# Patient Record
Sex: Female | Born: 1965 | Race: White | Hispanic: No | Marital: Single | Smoking: Former smoker
Health system: Southern US, Community
[De-identification: ages and names within clinical notes are randomized; demographics above are authoritative.]

## PROBLEM LIST (undated history)

## (undated) DIAGNOSIS — I10 Essential (primary) hypertension: Secondary | ICD-10-CM

## (undated) DIAGNOSIS — E785 Hyperlipidemia, unspecified: Secondary | ICD-10-CM

## (undated) DIAGNOSIS — K219 Gastro-esophageal reflux disease without esophagitis: Secondary | ICD-10-CM

## (undated) DIAGNOSIS — F419 Anxiety disorder, unspecified: Secondary | ICD-10-CM

## (undated) DIAGNOSIS — G4730 Sleep apnea, unspecified: Secondary | ICD-10-CM

## (undated) DIAGNOSIS — J189 Pneumonia, unspecified organism: Secondary | ICD-10-CM

## (undated) DIAGNOSIS — J302 Other seasonal allergic rhinitis: Secondary | ICD-10-CM

## (undated) HISTORY — PX: EYE SURGERY: SHX253

## (undated) HISTORY — DX: Gastro-esophageal reflux disease without esophagitis: K21.9

## (undated) HISTORY — PX: NOVASURE ABLATION: SHX5394

## (undated) HISTORY — PX: ABDOMINAL HYSTERECTOMY: SHX81

## (undated) HISTORY — PX: TONSILLECTOMY: SUR1361

## (undated) HISTORY — PX: WISDOM TOOTH EXTRACTION: SHX21

---

## 2002-10-21 ENCOUNTER — Emergency Department (HOSPITAL_COMMUNITY): Admission: AC | Admit: 2002-10-21 | Discharge: 2002-10-21 | Payer: Self-pay

## 2002-10-21 ENCOUNTER — Encounter: Payer: Self-pay | Admitting: Emergency Medicine

## 2002-12-09 ENCOUNTER — Other Ambulatory Visit: Admission: RE | Admit: 2002-12-09 | Discharge: 2002-12-09 | Payer: Self-pay | Admitting: *Deleted

## 2003-12-10 ENCOUNTER — Other Ambulatory Visit: Admission: RE | Admit: 2003-12-10 | Discharge: 2003-12-10 | Payer: Self-pay | Admitting: *Deleted

## 2004-05-25 ENCOUNTER — Ambulatory Visit (HOSPITAL_COMMUNITY): Admission: RE | Admit: 2004-05-25 | Discharge: 2004-05-25 | Payer: Self-pay | Admitting: Family Medicine

## 2008-04-25 ENCOUNTER — Emergency Department (HOSPITAL_COMMUNITY): Admission: EM | Admit: 2008-04-25 | Discharge: 2008-04-25 | Payer: Self-pay | Admitting: Emergency Medicine

## 2008-07-29 ENCOUNTER — Encounter: Admission: RE | Admit: 2008-07-29 | Discharge: 2008-07-29 | Payer: Self-pay | Admitting: Family Medicine

## 2008-08-11 ENCOUNTER — Encounter: Admission: RE | Admit: 2008-08-11 | Discharge: 2008-08-11 | Payer: Self-pay | Admitting: Family Medicine

## 2009-01-25 ENCOUNTER — Encounter: Admission: RE | Admit: 2009-01-25 | Discharge: 2009-01-25 | Payer: Self-pay | Admitting: Family Medicine

## 2009-08-05 ENCOUNTER — Encounter: Admission: RE | Admit: 2009-08-05 | Discharge: 2009-08-05 | Payer: Self-pay | Admitting: Family Medicine

## 2010-08-10 ENCOUNTER — Encounter: Admission: RE | Admit: 2010-08-10 | Discharge: 2010-08-10 | Payer: Self-pay | Admitting: Family Medicine

## 2011-07-28 ENCOUNTER — Other Ambulatory Visit: Payer: Self-pay | Admitting: Family Medicine

## 2011-07-28 DIAGNOSIS — Z1231 Encounter for screening mammogram for malignant neoplasm of breast: Secondary | ICD-10-CM

## 2011-08-21 ENCOUNTER — Ambulatory Visit
Admission: RE | Admit: 2011-08-21 | Discharge: 2011-08-21 | Disposition: A | Discharge status: Home / Self Care | Payer: BC Managed Care – PPO | Source: Ambulatory Visit | Attending: Family Medicine | Admitting: Family Medicine

## 2011-08-21 DIAGNOSIS — Z1231 Encounter for screening mammogram for malignant neoplasm of breast: Secondary | ICD-10-CM

## 2012-07-31 ENCOUNTER — Other Ambulatory Visit: Payer: Self-pay | Admitting: Family Medicine

## 2012-07-31 DIAGNOSIS — Z1231 Encounter for screening mammogram for malignant neoplasm of breast: Secondary | ICD-10-CM

## 2012-09-12 ENCOUNTER — Ambulatory Visit
Admission: RE | Admit: 2012-09-12 | Discharge: 2012-09-12 | Disposition: A | Discharge status: Home / Self Care | Payer: BC Managed Care – PPO | Source: Ambulatory Visit | Attending: Family Medicine | Admitting: Family Medicine

## 2012-09-12 DIAGNOSIS — Z1231 Encounter for screening mammogram for malignant neoplasm of breast: Secondary | ICD-10-CM

## 2013-09-03 ENCOUNTER — Other Ambulatory Visit: Payer: Self-pay

## 2013-09-03 DIAGNOSIS — Z1231 Encounter for screening mammogram for malignant neoplasm of breast: Secondary | ICD-10-CM

## 2013-10-09 ENCOUNTER — Ambulatory Visit
Admission: RE | Admit: 2013-10-09 | Discharge: 2013-10-09 | Disposition: A | Discharge status: Home / Self Care | Payer: BC Managed Care – PPO | Source: Ambulatory Visit

## 2013-10-09 DIAGNOSIS — Z1231 Encounter for screening mammogram for malignant neoplasm of breast: Secondary | ICD-10-CM

## 2013-10-10 ENCOUNTER — Other Ambulatory Visit: Payer: Self-pay | Admitting: Family Medicine

## 2013-10-10 DIAGNOSIS — R928 Other abnormal and inconclusive findings on diagnostic imaging of breast: Secondary | ICD-10-CM

## 2013-10-21 ENCOUNTER — Ambulatory Visit
Admission: RE | Admit: 2013-10-21 | Discharge: 2013-10-21 | Disposition: A | Discharge status: Home / Self Care | Payer: BC Managed Care – PPO | Source: Ambulatory Visit | Attending: Family Medicine | Admitting: Family Medicine

## 2013-10-21 DIAGNOSIS — R928 Other abnormal and inconclusive findings on diagnostic imaging of breast: Secondary | ICD-10-CM

## 2014-11-04 ENCOUNTER — Other Ambulatory Visit: Payer: Self-pay

## 2014-11-04 DIAGNOSIS — Z1231 Encounter for screening mammogram for malignant neoplasm of breast: Secondary | ICD-10-CM

## 2014-11-16 ENCOUNTER — Ambulatory Visit: Payer: Self-pay

## 2014-11-17 ENCOUNTER — Ambulatory Visit
Admission: RE | Admit: 2014-11-17 | Discharge: 2014-11-17 | Disposition: A | Discharge status: Home / Self Care | Payer: BLUE CROSS/BLUE SHIELD | Source: Ambulatory Visit

## 2014-11-17 DIAGNOSIS — Z1231 Encounter for screening mammogram for malignant neoplasm of breast: Secondary | ICD-10-CM

## 2015-10-18 ENCOUNTER — Other Ambulatory Visit: Payer: Self-pay

## 2015-10-18 DIAGNOSIS — Z1231 Encounter for screening mammogram for malignant neoplasm of breast: Secondary | ICD-10-CM

## 2015-11-19 ENCOUNTER — Ambulatory Visit
Admission: RE | Admit: 2015-11-19 | Discharge: 2015-11-19 | Disposition: A | Discharge status: Home / Self Care | Payer: BLUE CROSS/BLUE SHIELD | Source: Ambulatory Visit

## 2015-11-19 DIAGNOSIS — Z1231 Encounter for screening mammogram for malignant neoplasm of breast: Secondary | ICD-10-CM

## 2016-01-27 DIAGNOSIS — N939 Abnormal uterine and vaginal bleeding, unspecified: Secondary | ICD-10-CM | POA: Diagnosis not present

## 2016-04-05 DIAGNOSIS — N920 Excessive and frequent menstruation with regular cycle: Secondary | ICD-10-CM | POA: Diagnosis not present

## 2016-04-06 ENCOUNTER — Other Ambulatory Visit: Payer: Self-pay | Admitting: Obstetrics and Gynecology

## 2016-04-07 ENCOUNTER — Encounter (HOSPITAL_COMMUNITY): Payer: Self-pay

## 2016-04-07 ENCOUNTER — Encounter (HOSPITAL_COMMUNITY)
Admission: RE | Admit: 2016-04-07 | Discharge: 2016-04-07 | Disposition: A | Discharge status: Home / Self Care | Payer: BLUE CROSS/BLUE SHIELD | Source: Ambulatory Visit | Attending: Obstetrics and Gynecology | Admitting: Obstetrics and Gynecology

## 2016-04-07 ENCOUNTER — Other Ambulatory Visit: Payer: Self-pay

## 2016-04-07 DIAGNOSIS — Z01812 Encounter for preprocedural laboratory examination: Secondary | ICD-10-CM | POA: Insufficient documentation

## 2016-04-07 HISTORY — DX: Essential (primary) hypertension: I10

## 2016-04-07 HISTORY — DX: Other seasonal allergic rhinitis: J30.2

## 2016-04-07 HISTORY — DX: Hyperlipidemia, unspecified: E78.5

## 2016-04-07 HISTORY — DX: Anxiety disorder, unspecified: F41.9

## 2016-04-07 LAB — BASIC METABOLIC PANEL
ANION GAP: 10 (ref 5–15)
BUN: 16 mg/dL (ref 6–20)
CALCIUM: 9.1 mg/dL (ref 8.9–10.3)
CO2: 23 mmol/L (ref 22–32)
Chloride: 101 mmol/L (ref 101–111)
Creatinine, Ser: 0.82 mg/dL (ref 0.44–1.00)
Glucose, Bld: 179 mg/dL — ABNORMAL HIGH (ref 65–99)
Potassium: 3.7 mmol/L (ref 3.5–5.1)
SODIUM: 134 mmol/L — AB (ref 135–145)

## 2016-04-07 LAB — CBC
HCT: 36 % (ref 36.0–46.0)
HEMOGLOBIN: 12 g/dL (ref 12.0–15.0)
MCH: 30.2 pg (ref 26.0–34.0)
MCHC: 33.3 g/dL (ref 30.0–36.0)
MCV: 90.5 fL (ref 78.0–100.0)
Platelets: 435 10*3/uL — ABNORMAL HIGH (ref 150–400)
RBC: 3.98 MIL/uL (ref 3.87–5.11)
RDW: 14 % (ref 11.5–15.5)
WBC: 18.6 10*3/uL — AB (ref 4.0–10.5)

## 2016-04-07 NOTE — Patient Instructions (Addendum)
Your procedure is scheduled on: Tuesday, 7/18  Enter through the Main Entrance of [facility] at: [place] up the phone at the desk and dial [phone].  Call this number if you have problems the morning of surgery: [phone].  Remember: Do NOT eat food after midnight Monday, 7/17  Do NOT drink clear liquids after 9am Tuesday, day of surgery.  Take these medicines the morning of surgery with a SIP OF WATER:  Losartan, simvastatin, celexa, zyrtec and flonase if needed.  Do NOT wear jewelry (body piercing), metal hair clips/bobby pins, make-up, or nail polish. Do NOT wear lotions, powders, or perfumes.  You may wear deoderant. Do NOT shave for 48 hours prior to surgery. Do NOT bring valuables to the hospital.  Have a responsible adult drive you home and stay with you for 24 hours after your procedure.  Home with friend Laurelyn Sickle cell [phone].

## 2016-04-07 NOTE — Patient Instructions (Signed)
Your procedure is scheduled on: Tuesday, 7/18  Enter through the Main Entrance of [facility] at: [place] up the phone at the desk and dial [phone].  Call this number if you have problems the morning of surgery: [phone].  Remember: Do NOT eat food after midnight Monday, 7/17  Do NOT drink clear liquids after: 9 am Tuesday, day of surgery  Take these medicines the morning of surgery with a SIP OF WATER: losartan, simvastatin, flonase if needed  Do NOT wear jewelry (body piercing), metal hair clips/bobby pins, make-up, or nail polish. Do NOT wear lotions, powders, or perfumes.  You may wear deoderant. Do NOT shave for 48 hours prior to surgery. Do NOT bring valuables to the hospital. Contacts, dentures, or bridgework may not be worn into surgery. Leave suitcase in car.  After surgery it may be brought to your room.  For patients admitted to the hospital, checkout time is 11:00 AM the day of discharge. Have a responsible adult drive you home and stay with you for 24 hours after your procedure

## 2016-04-10 MED ORDER — CEFAZOLIN SODIUM-DEXTROSE 2-4 GM/100ML-% IV SOLN
2.0000 g | INTRAVENOUS | Status: AC
  Administered 2016-04-11: 2 g via INTRAVENOUS

## 2016-04-10 NOTE — H&P (Signed)
NAME:  Jenny Harrington, Jenny Harrington NO.:  [id number]  MEDICAL RECORD NO.:  [id number]  LOCATION:  [facility]                         FACILITY:  [facility]  PHYSICIAN:  Lovenia Kim, M.D.DATE OF BIRTH:  [birth year]  DATE OF ADMISSION:  04/06/2016 DATE OF DISCHARGE:                             HISTORY & PHYSICAL   CHIEF COMPLAINT:  Refractory menorrhagia.  HISTORY OF PRESENT ILLNESS:  A 50 year old white female, G0, P0 with a history of failed NovaSure ablation for HTA ablation.  ALLERGIES:  She has no known allergies.  MEDICATIONS:  Birth control pills.  Zyrtec as needed.  Aleve as needed, baby aspirin, simvastatin, losartan, multivitamin, Celexa.  SOCIAL HISTORY:  She is a nonsmoker, nondrinker.  She denies domestic or physical violence.  SURGICAL HISTORY:  Remarkable for a NovaSure ablation __________ 2016 with persistent menorrhagia.  She has a history of tonsillectomy.  No previous pregnancy.  PHYSICAL EXAMINATION:  GENERAL:  She is a well-developed, well- nourished, white female, in no acute distress. HEENT:  Normal. NECK:  Supple.  Full range of motion. LUNGS:  Clear. ABDOMEN:  Soft, nontender. PELVIC:  Reveals a bulky anteflexed uterus with known fibroids.  No adnexal masses. EXTREMITIES:  There are no cords. NEUROLOGIC:  Nonfocal. SKIN:  Intact.  IMPRESSION:  Refractory menorrhagia with failed NovaSure ablation.  PLAN:  Proceed with HTA ablation.  Risks and benefits discussed. Inability to predict the future outcome has been acknowledged.  The patient declines hysterectomy.  At this time, not a candidate for repeat NovaSure ablation. Risks of anesthesia, infection, bleeding, injury to surrounding organs, possible need for repair were discussed.  Delayed versus immediate complications to include bowel and bladder injury noted.  The patient acknowledges and wishes to proceed.     Lovenia Kim, M.D.     RJT/MEDQ  D:  04/10/2016  T:  04/10/2016  Job:   [id number]

## 2016-04-11 ENCOUNTER — Ambulatory Visit (HOSPITAL_COMMUNITY): Payer: BLUE CROSS/BLUE SHIELD | Admitting: Anesthesiology

## 2016-04-11 ENCOUNTER — Encounter (HOSPITAL_COMMUNITY)
Admission: RE | Disposition: A | Discharge status: Home / Self Care | Payer: Self-pay | Source: Ambulatory Visit | Attending: Obstetrics and Gynecology

## 2016-04-11 ENCOUNTER — Encounter (HOSPITAL_COMMUNITY): Payer: Self-pay | Admitting: *Deleted

## 2016-04-11 ENCOUNTER — Ambulatory Visit (HOSPITAL_COMMUNITY)
Admission: RE | Admit: 2016-04-11 | Discharge: 2016-04-11 | Disposition: A | Discharge status: Home / Self Care | Payer: BLUE CROSS/BLUE SHIELD | Source: Ambulatory Visit | Attending: Obstetrics and Gynecology | Admitting: Obstetrics and Gynecology

## 2016-04-11 DIAGNOSIS — Z87891 Personal history of nicotine dependence: Secondary | ICD-10-CM | POA: Diagnosis not present

## 2016-04-11 DIAGNOSIS — N920 Excessive and frequent menstruation with regular cycle: Secondary | ICD-10-CM | POA: Diagnosis not present

## 2016-04-11 DIAGNOSIS — I10 Essential (primary) hypertension: Secondary | ICD-10-CM | POA: Insufficient documentation

## 2016-04-11 HISTORY — PX: DILITATION & CURRETTAGE/HYSTROSCOPY WITH HYDROTHERMAL ABLATION: SHX5570

## 2016-04-11 SURGERY — DILATATION & CURETTAGE/HYSTEROSCOPY WITH HYDROTHERMAL ABLATION
Anesthesia: General | Site: Vagina

## 2016-04-11 MED ORDER — SODIUM CHLORIDE 0.9 % IR SOLN
Status: DC | PRN
  Administered 2016-04-11: 3000 mL

## 2016-04-11 MED ORDER — ONDANSETRON HCL 4 MG/2ML IJ SOLN
INTRAMUSCULAR | Status: DC | PRN
  Administered 2016-04-11: 4 mg via INTRAVENOUS

## 2016-04-11 MED ORDER — DEXAMETHASONE SODIUM PHOSPHATE 4 MG/ML IJ SOLN
INTRAMUSCULAR | Status: DC | PRN
  Administered 2016-04-11: 4 mg via INTRAVENOUS

## 2016-04-11 MED ORDER — BUPIVACAINE HCL (PF) 0.25 % IJ SOLN
INTRAMUSCULAR | Status: AC
  Filled 2016-04-11: qty 10

## 2016-04-11 MED ORDER — LIDOCAINE 2% (20 MG/ML) 5 ML SYRINGE
INTRAMUSCULAR | Status: DC | PRN
  Administered 2016-04-11: 60 mg via INTRAVENOUS

## 2016-04-11 MED ORDER — FENTANYL CITRATE (PF) 100 MCG/2ML IJ SOLN
INTRAMUSCULAR | Status: DC | PRN
  Administered 2016-04-11: 100 ug via INTRAVENOUS

## 2016-04-11 MED ORDER — OXYCODONE-ACETAMINOPHEN 5-325 MG PO TABS: 1.0000 | ORAL_TABLET | ORAL | Status: DC | PRN

## 2016-04-11 MED ORDER — SCOPOLAMINE 1 MG/3DAYS TD PT72
1.0000 | MEDICATED_PATCH | Freq: Once | TRANSDERMAL | Status: DC
  Administered 2016-04-11: 1.5 mg via TRANSDERMAL

## 2016-04-11 MED ORDER — KETOROLAC TROMETHAMINE 30 MG/ML IJ SOLN
INTRAMUSCULAR | Status: DC | PRN
  Administered 2016-04-11: 30 mg via INTRAVENOUS

## 2016-04-11 MED ORDER — BUPIVACAINE HCL (PF) 0.25 % IJ SOLN
INTRAMUSCULAR | Status: DC | PRN
  Administered 2016-04-11: 10 mL

## 2016-04-11 MED ORDER — DEXAMETHASONE SODIUM PHOSPHATE 4 MG/ML IJ SOLN
INTRAMUSCULAR | Status: AC
  Filled 2016-04-11: qty 1

## 2016-04-11 MED ORDER — FENTANYL CITRATE (PF) 100 MCG/2ML IJ SOLN: 25.0000 ug | INTRAMUSCULAR | Status: DC | PRN

## 2016-04-11 MED ORDER — LIDOCAINE HCL (CARDIAC) 20 MG/ML IV SOLN
INTRAVENOUS | Status: AC
  Filled 2016-04-11: qty 5

## 2016-04-11 MED ORDER — LACTATED RINGERS IV SOLN
INTRAVENOUS | Status: DC
  Administered 2016-04-11 (×3): via INTRAVENOUS

## 2016-04-11 MED ORDER — GLYCOPYRROLATE 0.2 MG/ML IJ SOLN
INTRAMUSCULAR | Status: DC | PRN
  Administered 2016-04-11: 0.2 mg via INTRAVENOUS

## 2016-04-11 MED ORDER — ONDANSETRON HCL 4 MG/2ML IJ SOLN
INTRAMUSCULAR | Status: AC
  Filled 2016-04-11: qty 2

## 2016-04-11 MED ORDER — PROPOFOL 10 MG/ML IV BOLUS
INTRAVENOUS | Status: AC
  Filled 2016-04-11: qty 20

## 2016-04-11 MED ORDER — SCOPOLAMINE 1 MG/3DAYS TD PT72
MEDICATED_PATCH | TRANSDERMAL | Status: DC
Start: 2016-04-11 — End: 2016-04-11
  Filled 2016-04-11: qty 1

## 2016-04-11 MED ORDER — FENTANYL CITRATE (PF) 100 MCG/2ML IJ SOLN
INTRAMUSCULAR | Status: AC
  Filled 2016-04-11: qty 2

## 2016-04-11 MED ORDER — MIDAZOLAM HCL 2 MG/2ML IJ SOLN
INTRAMUSCULAR | Status: AC
  Filled 2016-04-11: qty 2

## 2016-04-11 MED ORDER — PROPOFOL 10 MG/ML IV BOLUS
INTRAVENOUS | Status: DC | PRN
  Administered 2016-04-11: 200 mg via INTRAVENOUS

## 2016-04-11 MED ORDER — MIDAZOLAM HCL 5 MG/5ML IJ SOLN
INTRAMUSCULAR | Status: DC | PRN
  Administered 2016-04-11: 2 mg via INTRAVENOUS

## 2016-04-11 MED ORDER — PROMETHAZINE HCL 25 MG/ML IJ SOLN: 6.2500 mg | INTRAMUSCULAR | Status: DC | PRN

## 2016-04-11 MED ORDER — HYDROCODONE-ACETAMINOPHEN 7.5-325 MG PO TABS: 1.0000 | ORAL_TABLET | Freq: Once | ORAL | Status: DC | PRN

## 2016-04-11 SURGICAL SUPPLY — 14 items
CLOTH BEACON ORANGE TIMEOUT ST (SAFETY) ×2 IMPLANT
CONTAINER PREFILL 10% NBF 60ML (FORM) IMPLANT
ELECT REM PT RETURN 9FT ADLT (ELECTROSURGICAL)
ELECTRODE REM PT RTRN 9FT ADLT (ELECTROSURGICAL) IMPLANT
GLOVE BIO SURGEON STRL SZ7.5 (GLOVE) ×2 IMPLANT
GLOVE BIOGEL PI IND STRL 7.0 (GLOVE) ×1 IMPLANT
GLOVE BIOGEL PI INDICATOR 7.0 (GLOVE) ×1
GOWN STRL REUS W/TWL LRG LVL3 (GOWN DISPOSABLE) ×6 IMPLANT
NS IRRIG 1000ML POUR BTL (IV SOLUTION) IMPLANT
PACK VAGINAL MINOR WOMEN LF (CUSTOM PROCEDURE TRAY) ×2 IMPLANT
PAD OB MATERNITY 4.3X12.25 (PERSONAL CARE ITEMS) ×2 IMPLANT
SET GENESYS HTA PROCERVA (MISCELLANEOUS) ×2 IMPLANT
TOWEL OR 17X24 6PK STRL BLUE (TOWEL DISPOSABLE) ×4 IMPLANT
WATER STERILE IRR 1000ML POUR (IV SOLUTION) ×2 IMPLANT

## 2016-04-11 NOTE — Anesthesia Preprocedure Evaluation (Signed)
Anesthesia Evaluation  Patient identified by MRN, date of birth, ID band Patient awake    Reviewed: Allergy & Precautions, H&P , NPO status , Patient's Chart, lab work & pertinent test results  History of Anesthesia Complications Negative for: history of anesthetic complications  Airway Mallampati: II  TM Distance: >3 FB Neck ROM: full    Dental no notable dental hx.    Pulmonary former smoker,    Pulmonary exam normal breath sounds clear to auscultation       Cardiovascular hypertension, Normal cardiovascular exam Rhythm:regular Rate:Normal     Neuro/Psych Anxiety negative neurological ROS     GI/Hepatic negative GI ROS, Neg liver ROS,   Endo/Other  negative endocrine ROS  Renal/GU negative Renal ROS     Musculoskeletal   Abdominal   Peds  Hematology negative hematology ROS (+)   Anesthesia Other Findings   Reproductive/Obstetrics negative OB ROS                             Anesthesia Physical Anesthesia Plan  ASA: II  Anesthesia Plan: General   Post-op Pain Management:    Induction: Intravenous  Airway Management Planned: LMA  Additional Equipment:   Intra-op Plan:   Post-operative Plan:   Informed Consent: I have reviewed the patients History and Physical, chart, labs and discussed the procedure including the risks, benefits and alternatives for the proposed anesthesia with the patient or authorized representative who has indicated his/her understanding and acceptance.   Dental Advisory Given  Plan Discussed with: Anesthesiologist, CRNA and Surgeon  Anesthesia Plan Comments:         Anesthesia Quick Evaluation

## 2016-04-11 NOTE — Anesthesia Procedure Notes (Signed)
Procedure Name: LMA Insertion Date/Time: 04/11/2016 1:51 PM Performed by: Vernice Jefferson Pre-anesthesia Checklist: Patient identified, Emergency Drugs available, Suction available, Timeout performed and Patient being monitored Patient Re-evaluated:Patient Re-evaluated prior to inductionOxygen Delivery Method: Circle system utilized Preoxygenation: Pre-oxygenation with 100% oxygen Intubation Type: IV induction LMA: LMA inserted Grade View: Grade II Number of attempts: 1 Dental Injury: Teeth and Oropharynx as per pre-operative assessment

## 2016-04-11 NOTE — Progress Notes (Signed)
Patient ID: Jenny Harrington, female   DOB: [birth year], 50 y.o.   MRN: [id number] Patient seen and examined. Consent witnessed and signed. No changes noted. Update completed.

## 2016-04-11 NOTE — Transfer of Care (Signed)
Immediate Anesthesia Transfer of Care Note  Patient: DANNELLE BARGERSTOCK  Procedure(s) Performed: Procedure(s): DILATATION & CURETTAGE/HYSTEROSCOPY WITH HYDROTHERMAL ABLATION (N/A)  Patient Location: PACU  Anesthesia Type:General  Level of Consciousness: awake, alert  and oriented  Airway & Oxygen Therapy: Patient Spontanous Breathing and Patient connected to nasal cannula oxygen  Post-op Assessment: Report given to RN and Post -op Vital signs reviewed and stable  Post vital signs: Reviewed and stable  Last Vitals:  Filed Vitals:   04/11/16 1212  BP: 154/94  Pulse: 73  Temp: 36.8 C  Resp: 20    Last Pain: There were no vitals filed for this visit.    Patients Stated Pain Goal: 3 ([id number])  Complications: No apparent anesthesia complications

## 2016-04-11 NOTE — Anesthesia Postprocedure Evaluation (Signed)
Anesthesia Post Note  Patient: Jenny Harrington  Procedure(s) Performed: Procedure(s) (LRB): DILATATION & CURETTAGE/HYSTEROSCOPY WITH HYDROTHERMAL ABLATION (N/A)  Patient location during evaluation: PACU Anesthesia Type: General Level of consciousness: awake and alert Pain management: pain level controlled Vital Signs Assessment: post-procedure vital signs reviewed and stable Respiratory status: spontaneous breathing, nonlabored ventilation, respiratory function stable and patient connected to nasal cannula oxygen Cardiovascular status: blood pressure returned to baseline and stable Postop Assessment: no signs of nausea or vomiting Anesthetic complications: no     Last Vitals:  Filed Vitals:   04/11/16 1430 04/11/16 1445  BP: 146/81 152/78  Pulse: 91 78  Temp:    Resp: 15 15    Last Pain:  Filed Vitals:   04/11/16 1456  PainSc: 0-No pain   Pain Goal: Patients Stated Pain Goal: 3 (04/11/16 1445)               Zenaida Deed

## 2016-04-11 NOTE — Discharge Instructions (Addendum)
DISCHARGE INSTRUCTIONS: HYSTEROSCOPY / ENDOMETRIAL ABLATION The following instructions have been prepared to help you care for yourself upon your return home.  May Remove Scop patch on or before  May take Ibuprofen after  May take stool softner while taking narcotic pain medication to prevent constipation.  Drink plenty of water.  Personal hygiene:  Use sanitary pads for vaginal drainage, not tampons.  Shower the day after your procedure.  NO tub baths, pools or Jacuzzis for 2-3 weeks.  Wipe front to back after using the bathroom.  Activity and limitations:  Do NOT drive or operate any equipment for 24 hours. The effects of anesthesia are still present and drowsiness may result.  Do NOT rest in bed all day.  Walking is encouraged.  Walk up and down stairs slowly.  You may resume your normal activity in one to two days or as indicated by your physician. Sexual activity: NO intercourse for at least 2 weeks after the procedure, or as indicated by your Doctor.  Diet: Eat a light meal as desired this evening. You may resume your usual diet tomorrow.  Return to Work: You may resume your work activities in one to two days or as indicated by Marine scientist.  What to expect after your surgery: Expect to have vaginal bleeding/discharge for 2-3 days and spotting for up to 10 days. It is not unusual to have soreness for up to 1-2 weeks. You may have a slight burning sensation when you urinate for the first day. Mild cramps may continue for a couple of days. You may have a regular period in 2-6 weeks.  Call your doctor for any of the following:  Excessive vaginal bleeding or clotting, saturating and changing one pad every hour.  Inability to urinate 6 hours after discharge from hospital.  Pain not relieved by pain medication.  Fever of 100.4 F or greater.  Unusual vaginal discharge or odor.  Return to office _________________Call for an appointment  ___________________ Patients signature: ______________________ Nurses signature ________________________  Post Anesthesia Care Unit [phone] Post Anesthesia Home Care Instructions  NO IBUPROFEN PRODUCTS UNTIL: 8:10 PM TONIGHT Post Anesthesia Home Care Instructions  Activity: Get plenty of rest for the remainder of the day. A responsible adult should stay with you for 24 hours following the procedure.  For the next 24 hours, DO NOT: -Drive a car -Paediatric nurse -Drink alcoholic beverages -Take any medication unless instructed by your physician -Make any legal decisions or sign important papers.  Meals: Start with liquid foods such as gelatin or soup. Progress to regular foods as tolerated. Avoid greasy, spicy, heavy foods. If nausea and/or vomiting occur, drink only clear liquids until the nausea and/or vomiting subsides. Call your physician if vomiting continues.  Special Instructions/Symptoms: Your throat may feel dry or sore from the anesthesia or the breathing tube placed in your throat during surgery. If this causes discomfort, gargle with warm salt water. The discomfort should disappear within 24 hours.  If you had a scopolamine patch placed behind your ear for the management of post- operative nausea and/or vomiting:  1. The medication in the patch is effective for 72 hours, after which it should be removed.  Wrap patch in a tissue and discard in the trash. Wash hands thoroughly with soap and water. 2. You may remove the patch earlier than 72 hours if you experience unpleasant side effects which may include dry mouth, dizziness or visual disturbances. 3. Avoid touching the patch. Wash your hands with soap and water  after contact with the patch.     Activity: Get plenty of rest for the remainder of the day. A responsible adult should stay with you for 24 hours following the procedure.  For the next 24 hours, DO NOT: -Drive a car -Paediatric nurse -Drink alcoholic  beverages -Take any medication unless instructed by your physician -Make any legal decisions or sign important papers.  Meals: Start with liquid foods such as gelatin or soup. Progress to regular foods as tolerated. Avoid greasy, spicy, heavy foods. If nausea and/or vomiting occur, drink only clear liquids until the nausea and/or vomiting subsides. Call your physician if vomiting continues.  Special Instructions/Symptoms: Your throat may feel dry or sore from the anesthesia or the breathing tube placed in your throat during surgery. If this causes discomfort, gargle with warm salt water. The discomfort should disappear within 24 hours.  If you had a scopolamine patch placed behind your ear for the management of post- operative nausea and/or vomiting:  1. The medication in the patch is effective for 72 hours, after which it should be removed.  Wrap patch in a tissue and discard in the trash. Wash hands thoroughly with soap and water. 2. You may remove the patch earlier than 72 hours if you experience unpleasant side effects which may include dry mouth, dizziness or visual disturbances. 3. Avoid touching the patch. Wash your hands with soap and water after contact with the patch.

## 2016-04-11 NOTE — Op Note (Signed)
04/11/2016  2:06 PM  PATIENT:  Jenny Harrington  50 y.o. female  PRE-OPERATIVE DIAGNOSIS:  Menorrhagia, Failed NovaSure Ablation  POST-OPERATIVE DIAGNOSIS:  Menorrhagia, Failed NovaSure Ablation  PROCEDURE:  Procedure(s): DILATATION & CURETTAGE/HYSTEROSCOPY WITH HYDROTHERMAL ABLATION  SURGEON:  Surgeon(s): Brien Few, MD  ASSISTANTS: none   ANESTHESIA:   local and general  ESTIMATED BLOOD LOSS: minimal  DRAINS: none   LOCAL MEDICATIONS USED:  MARCAINE    and Amount: 10 ml  SPECIMEN:  No Specimen  DISPOSITION OF SPECIMEN:  N/A  COUNTS:  YES  DICTATION #: done  PLAN OF CARE: dc home  PATIENT DISPOSITION:  PACU - hemodynamically stable.

## 2016-04-12 NOTE — Op Note (Signed)
NAME:  Jenny Harrington, Jenny Harrington NO.:  [id number]  MEDICAL RECORD NO.:  [id number]  LOCATION:  [place]                           FACILITY:  [facility]  PHYSICIAN:  Lovenia Kim, M.D.DATE OF BIRTH:  1965-12-28  DATE OF PROCEDURE: DATE OF DISCHARGE:  04/07/2016                              OPERATIVE REPORT   PREOPERATIVE DIAGNOSIS:  Refractory menorrhagia with failed NovaSure endometrial ablation.  POSTOPERATIVE DIAGNOSIS:  Refractory menorrhagia with failed NovaSure endometrial ablation.  PROCEDURE:  Diagnostic hysteroscopy with HTA ablation.  SURGEON:  Lovenia Kim, MD.  ASSISTANT:  None.  ANESTHESIA:  General and local.  ESTIMATED BLOOD LOSS:  Less than 50 mL.  FLUID DEFICIT:  Less than 50 mL.  COMPLICATIONS:  None.  DRAINS:  None.  COUNTS:  Correct.  The patient was sent to recovery in good condition.  BRIEF OPERATIVE NOTE:  After being apprised of risks of anesthesia, infection, bleeding, injury to surrounding organs, possible need for repair, delayed versus immediate complications to include bowel and bladder injury, possible need for repair, patient was brought to the operating room where she was administered a general anesthetic without complications.  Prepped and draped in usual sterile fashion. Catheterized for 200 mL of clear urine.  Exam under anesthesia revealed a bulky anteflexed uterus with known uterine fibroids, approximately 8- 10 weeks' size.  No adnexal masses appreciated.  At this time, cervix easily dilated up 27 Pratt dilator.  Hysteroscope reveals a narrowed stenotic endometrial cavity, no evidence of perforation.  HTA ablation was performed with the appropriate seal noted to warming and cooling phases as previously noted.  Well- ablated small cavity is noted postprocedure.  All instruments were removed.  Good hemostasis was noted.  The patient tolerated the procedure well, was awakened, and transferred to recovery in  good condition.     Lovenia Kim, M.D.     RJT/MEDQ  D:  04/11/2016  T:  04/12/2016  Job:  [id number]

## 2016-04-14 ENCOUNTER — Encounter (HOSPITAL_COMMUNITY): Payer: Self-pay | Admitting: Obstetrics and Gynecology

## 2016-05-17 DIAGNOSIS — Z01419 Encounter for gynecological examination (general) (routine) without abnormal findings: Secondary | ICD-10-CM | POA: Diagnosis not present

## 2016-05-17 DIAGNOSIS — Z6834 Body mass index (BMI) 34.0-34.9, adult: Secondary | ICD-10-CM | POA: Diagnosis not present

## 2016-06-21 DIAGNOSIS — H524 Presbyopia: Secondary | ICD-10-CM | POA: Diagnosis not present

## 2016-06-21 DIAGNOSIS — H5213 Myopia, bilateral: Secondary | ICD-10-CM | POA: Diagnosis not present

## 2016-06-21 DIAGNOSIS — Z9889 Other specified postprocedural states: Secondary | ICD-10-CM | POA: Diagnosis not present

## 2016-06-27 ENCOUNTER — Other Ambulatory Visit: Payer: Self-pay | Admitting: Obstetrics and Gynecology

## 2016-07-03 NOTE — Patient Instructions (Signed)
Your procedure is scheduled on:  Thursday, Oct. 19, 2017  Enter through the [employer] of [facility] at:  11:30 AM  Pick up the phone at the desk and dial [phone].  Call this number if you have problems the morning of surgery: [phone].  Remember: Do NOT eat food:  After Midnight Wednesday, Oct. 18, 2017  Do NOT drink clear liquids after:  9:00 AM day of surgery  Take these medicines the morning of surgery with a SIP OF WATER:  Citalopram, Losartan, Simvastatin  Stop ALL herbal medications at this time   Do NOT wear jewelry (body piercing), metal hair clips/bobby pins, make-up, or nail polish. Do NOT wear lotions, powders, or perfumes.  You may wear deodorant. Do NOT shave for 48 hours prior to surgery. Do NOT bring valuables to the hospital. Contacts, dentures, or bridgework may not be worn into surgery.  Leave suitcase in car.  After surgery it may be brought to your room.  For patients admitted to the hospital, checkout time is 11:00 AM the day of discharge.

## 2016-07-04 ENCOUNTER — Encounter (HOSPITAL_COMMUNITY): Payer: Self-pay

## 2016-07-04 ENCOUNTER — Encounter (HOSPITAL_COMMUNITY)
Admission: RE | Admit: 2016-07-04 | Discharge: 2016-07-04 | Disposition: A | Discharge status: Home / Self Care | Payer: BLUE CROSS/BLUE SHIELD | Source: Ambulatory Visit | Attending: Obstetrics and Gynecology | Admitting: Obstetrics and Gynecology

## 2016-07-04 DIAGNOSIS — Z01812 Encounter for preprocedural laboratory examination: Secondary | ICD-10-CM | POA: Insufficient documentation

## 2016-07-04 LAB — BASIC METABOLIC PANEL
Anion gap: 8 (ref 5–15)
BUN: 18 mg/dL (ref 6–20)
CHLORIDE: 99 mmol/L — AB (ref 101–111)
CO2: 28 mmol/L (ref 22–32)
CREATININE: 0.78 mg/dL (ref 0.44–1.00)
Calcium: 9.1 mg/dL (ref 8.9–10.3)
GFR calc Af Amer: >60 mL/min (ref >60)
GFR calc non Af Amer: >60 mL/min (ref >60)
GLUCOSE: 93 mg/dL (ref 65–99)
POTASSIUM: 4.4 mmol/L (ref 3.5–5.1)
SODIUM: 135 mmol/L (ref 135–145)

## 2016-07-04 LAB — CBC
HEMATOCRIT: 37.5 % (ref 36.0–46.0)
Hemoglobin: 12.6 g/dL (ref 12.0–15.0)
MCH: 30.1 pg (ref 26.0–34.0)
MCHC: 33.6 g/dL (ref 30.0–36.0)
MCV: 89.7 fL (ref 78.0–100.0)
PLATELETS: 394 10*3/uL (ref 150–400)
RBC: 4.18 MIL/uL (ref 3.87–5.11)
RDW: 13.6 % (ref 11.5–15.5)
WBC: 10.7 10*3/uL — ABNORMAL HIGH (ref 4.0–10.5)

## 2016-07-04 LAB — ABO/RH: ABO/RH(D): O POS

## 2016-07-04 LAB — TYPE AND SCREEN
ABO/RH(D): O POS
Antibody Screen: NEGATIVE

## 2016-07-12 NOTE — H&P (Signed)
NAME:  ADYLYNN, GANS NO.:  [id number]  MEDICAL RECORD NO.:  [id number]  LOCATION:  [facility]                         FACILITY:  [facility]  PHYSICIAN:  Lovenia Kim, M.D.DATE OF BIRTH:  [birth year]  DATE OF ADMISSION:  07/13/2016 DATE OF DISCHARGE:                             HISTORY & PHYSICAL   CHIEF COMPLAINT:  Symptomatic fibroids with refractory bleeding history of failed ablation x2.  HISTORY OF PRESENT ILLNESS:  A 50 year old white female, G0 who presents now status post endometrial ablation x2 with persistent bleeding, dysmenorrhea, and symptomatic fibroids.  ALLERGIES:  She has no known drug allergies.  MEDICATIONS:  Birth control pills, multivitamin, citalopram, Zyrtec, simvastatin, losartan.  PAST MEDICAL HISTORY:  She has a medical history remarkable for hypertension.  FAMILY HISTORY:  She has a family history of hypertension, breast cancer.  SOCIAL HISTORY:  She is a nonsmoker, nondrinker.  She denies domestic or physical violence.  SURGICAL HISTORY:  Remarkable for endometrial ablation and tonsillectomy.  She has a noncontributory pregnancy history.  PHYSICAL EXAMINATION:  GENERAL:  She is a well-developed, well- nourished, white female, in no acute distress. HEENT:  Normal. NECK:  Supple.  Full range of motion. LUNGS:  Clear. HEART:  Regular rate and rhythm. ABDOMEN:  Soft, nontender. PELVIC:  Reveals a 10 to 12-week sized anteflexed uterus and no adnexal masses. EXTREMITIES:  There are no cords. NEUROLOGIC:  Nonfocal. SKIN:  Intact.  IMPRESSION:  Symptomatic fibroids with failed endometrial ablation x2.  PLAN:  To proceed with DaVinci-assisted total laparoscopic hysterectomy, bilateral salpingectomy.  Risks of anesthesia, infection, bleeding, injury to surrounding organs, possible need for repair was discussed, delayed versus immediate complications to include bowel and bladder injury noted, possible need for open procedure  discussed.  The use of Alexis contained extraction system in lieu of morcellation has been discussed.  The patient acknowledges and wishes to proceed.     Lovenia Kim, M.D.     RJT/MEDQ  D:  07/12/2016  T:  07/12/2016  Job:  [id number]

## 2016-07-13 ENCOUNTER — Ambulatory Visit (HOSPITAL_COMMUNITY): Payer: BLUE CROSS/BLUE SHIELD | Admitting: Certified Registered Nurse Anesthetist

## 2016-07-13 ENCOUNTER — Encounter (HOSPITAL_COMMUNITY)
Admission: RE | Disposition: A | Discharge status: Home / Self Care | Payer: Self-pay | Source: Ambulatory Visit | Attending: Obstetrics and Gynecology

## 2016-07-13 ENCOUNTER — Ambulatory Visit (HOSPITAL_COMMUNITY)
Admission: RE | Admit: 2016-07-13 | Discharge: 2016-07-13 | Disposition: A | Discharge status: Home / Self Care | Payer: BLUE CROSS/BLUE SHIELD | Source: Ambulatory Visit | Attending: Obstetrics and Gynecology | Admitting: Obstetrics and Gynecology

## 2016-07-13 ENCOUNTER — Encounter (HOSPITAL_COMMUNITY): Payer: Self-pay | Admitting: Emergency Medicine

## 2016-07-13 DIAGNOSIS — Z7982 Long term (current) use of aspirin: Secondary | ICD-10-CM | POA: Insufficient documentation

## 2016-07-13 DIAGNOSIS — Z5329 Procedure and treatment not carried out because of patient's decision for other reasons: Secondary | ICD-10-CM | POA: Diagnosis not present

## 2016-07-13 DIAGNOSIS — I10 Essential (primary) hypertension: Secondary | ICD-10-CM | POA: Diagnosis not present

## 2016-07-13 DIAGNOSIS — Z79899 Other long term (current) drug therapy: Secondary | ICD-10-CM | POA: Diagnosis not present

## 2016-07-13 DIAGNOSIS — Z803 Family history of malignant neoplasm of breast: Secondary | ICD-10-CM | POA: Insufficient documentation

## 2016-07-13 DIAGNOSIS — Z8249 Family history of ischemic heart disease and other diseases of the circulatory system: Secondary | ICD-10-CM | POA: Insufficient documentation

## 2016-07-13 DIAGNOSIS — N938 Other specified abnormal uterine and vaginal bleeding: Secondary | ICD-10-CM | POA: Diagnosis not present

## 2016-07-13 DIAGNOSIS — N946 Dysmenorrhea, unspecified: Secondary | ICD-10-CM | POA: Insufficient documentation

## 2016-07-13 DIAGNOSIS — D259 Leiomyoma of uterus, unspecified: Secondary | ICD-10-CM | POA: Diagnosis not present

## 2016-07-13 SURGERY — CANCELLED PROCEDURE
Laterality: Bilateral

## 2016-07-13 MED ORDER — MIDAZOLAM HCL 2 MG/2ML IJ SOLN
INTRAMUSCULAR | Status: AC
  Filled 2016-07-13: qty 2

## 2016-07-13 MED ORDER — SCOPOLAMINE 1 MG/3DAYS TD PT72
1.0000 | MEDICATED_PATCH | Freq: Once | TRANSDERMAL | Status: DC
  Administered 2016-07-13: 1.5 mg via TRANSDERMAL

## 2016-07-13 MED ORDER — ROPIVACAINE HCL 5 MG/ML IJ SOLN
INTRAMUSCULAR | Status: AC
  Filled 2016-07-13: qty 30

## 2016-07-13 MED ORDER — FENTANYL CITRATE (PF) 250 MCG/5ML IJ SOLN
INTRAMUSCULAR | Status: AC
  Filled 2016-07-13: qty 5

## 2016-07-13 MED ORDER — BUPIVACAINE HCL (PF) 0.25 % IJ SOLN
INTRAMUSCULAR | Status: AC
  Filled 2016-07-13: qty 30

## 2016-07-13 MED ORDER — LACTATED RINGERS IV SOLN
INTRAVENOUS | Status: DC
  Administered 2016-07-13: 12:00:00 via INTRAVENOUS

## 2016-07-13 MED ORDER — SCOPOLAMINE 1 MG/3DAYS TD PT72
MEDICATED_PATCH | TRANSDERMAL | Status: AC
  Administered 2016-07-13: 1.5 mg via TRANSDERMAL
  Filled 2016-07-13: qty 1

## 2016-07-13 MED ORDER — ARTIFICIAL TEARS OP OINT
TOPICAL_OINTMENT | OPHTHALMIC | Status: AC
  Filled 2016-07-13: qty 3.5

## 2016-07-13 MED ORDER — SODIUM CHLORIDE 0.9 % IJ SOLN
INTRAMUSCULAR | Status: AC
  Filled 2016-07-13: qty 50

## 2016-07-13 MED ORDER — CEFAZOLIN SODIUM-DEXTROSE 2-4 GM/100ML-% IV SOLN: 2.0000 g | INTRAVENOUS | Status: DC

## 2016-07-13 SURGICAL SUPPLY — 55 items
BARRIER ADHS 3X4 INTERCEED (GAUZE/BANDAGES/DRESSINGS) IMPLANT
CATH FOLEY 3WAY  5CC 16FR (CATHETERS) ×1
CATH FOLEY 3WAY 5CC 16FR (CATHETERS) ×2 IMPLANT
CELL SAVER LIPIGURD (MISCELLANEOUS) IMPLANT
CLOTH BEACON ORANGE TIMEOUT ST (SAFETY) ×3 IMPLANT
CONT PATH 16OZ SNAP LID 3702 (MISCELLANEOUS) IMPLANT
COVER BACK TABLE 60X90IN (DRAPES) ×6 IMPLANT
COVER TIP SHEARS 8 DVNC (MISCELLANEOUS) ×2 IMPLANT
COVER TIP SHEARS 8MM DA VINCI (MISCELLANEOUS) ×1
DECANTER SPIKE VIAL GLASS SM (MISCELLANEOUS) ×9 IMPLANT
DURAPREP 26ML APPLICATOR (WOUND CARE) ×3 IMPLANT
ELECT REM PT RETURN 9FT ADLT (ELECTROSURGICAL)
ELECTRODE REM PT RTRN 9FT ADLT (ELECTROSURGICAL) IMPLANT
EXTRT SYSTEM ALEXIS 14CM (MISCELLANEOUS)
GAUZE VASELINE 3X9 (GAUZE/BANDAGES/DRESSINGS) IMPLANT
GLOVE BIO SURGEON STRL SZ7.5 (GLOVE) ×9 IMPLANT
GLOVE BIOGEL PI IND STRL 7.0 (GLOVE) ×4 IMPLANT
GLOVE BIOGEL PI INDICATOR 7.0 (GLOVE) ×2
KIT ACCESSORY DA VINCI DISP (KITS) ×1
KIT ACCESSORY DVNC DISP (KITS) ×2 IMPLANT
LEGGING LITHOTOMY PAIR STRL (DRAPES) ×3 IMPLANT
LIQUID BAND (GAUZE/BANDAGES/DRESSINGS) ×3 IMPLANT
NEEDLE INSUFFLATION 150MM (ENDOMECHANICALS) ×3 IMPLANT
OCCLUDER COLPOPNEUMO (BALLOONS) ×3 IMPLANT
PACK ROBOT WH (CUSTOM PROCEDURE TRAY) ×3 IMPLANT
PACK ROBOTIC GOWN (GOWN DISPOSABLE) ×3 IMPLANT
PACK TRENDGUARD 450 HYBRID PRO (MISCELLANEOUS) IMPLANT
PACK TRENDGUARD 600 HYBRD PROC (MISCELLANEOUS) IMPLANT
PAD PREP 24X48 CUFFED NSTRL (MISCELLANEOUS) IMPLANT
POUCH LAPAROSCOPIC INSTRUMENT (MISCELLANEOUS) IMPLANT
PROTECTOR NERVE ULNAR (MISCELLANEOUS) IMPLANT
SET CYSTO W/LG BORE CLAMP LF (SET/KITS/TRAYS/PACK) IMPLANT
SET IRRIG TUBING LAPAROSCOPIC (IRRIGATION / IRRIGATOR) ×3 IMPLANT
SET TRI-LUMEN FLTR TB AIRSEAL (TUBING) ×3 IMPLANT
SUT VIC AB 0 CT1 27 (SUTURE) ×2
SUT VIC AB 0 CT1 27XBRD ANBCTR (SUTURE) ×4 IMPLANT
SUT VICRYL 0 UR6 27IN ABS (SUTURE) ×3 IMPLANT
SUT VICRYL RAPIDE 4/0 PS 2 (SUTURE) ×6 IMPLANT
SUT VLOC 180 0 9IN  GS21 (SUTURE)
SUT VLOC 180 0 9IN GS21 (SUTURE) IMPLANT
SYR 50ML LL SCALE MARK (SYRINGE) ×3 IMPLANT
SYRINGE 10CC LL (SYRINGE) ×3 IMPLANT
TIP RUMI ORANGE 6.7MMX12CM (TIP) IMPLANT
TIP UTERINE 5.1X6CM LAV DISP (MISCELLANEOUS) IMPLANT
TIP UTERINE 6.7X10CM GRN DISP (MISCELLANEOUS) IMPLANT
TIP UTERINE 6.7X6CM WHT DISP (MISCELLANEOUS) IMPLANT
TIP UTERINE 6.7X8CM BLUE DISP (MISCELLANEOUS) IMPLANT
TOWEL OR 17X24 6PK STRL BLUE (TOWEL DISPOSABLE) ×9 IMPLANT
TRENDGUARD 450 HYBRID PRO PACK (MISCELLANEOUS)
TRENDGUARD 600 HYBRID PROC PK (MISCELLANEOUS)
TROCAR DISP BLADELESS 8 DVNC (TROCAR) ×2 IMPLANT
TROCAR DISP BLADELESS 8MM (TROCAR) ×1
TROCAR PORT AIRSEAL 5X120 (TROCAR) ×3 IMPLANT
TROCAR Z-THREAD 12X150 (TROCAR) ×3 IMPLANT
WATER STERILE IRR 1000ML POUR (IV SOLUTION) ×3 IMPLANT

## 2016-07-13 NOTE — Anesthesia Preprocedure Evaluation (Signed)
Anesthesia Evaluation  Patient identified by MRN, date of birth, ID band Patient awake    Reviewed: Allergy & Precautions, H&P , NPO status , Patient's Chart, lab work & pertinent test results  History of Anesthesia Complications Negative for: history of anesthetic complications  Airway Mallampati: II  TM Distance: >3 FB Neck ROM: full    Dental no notable dental hx.    Pulmonary former smoker,    Pulmonary exam normal breath sounds clear to auscultation       Cardiovascular hypertension, Pt. on medications Normal cardiovascular exam Rhythm:regular Rate:Normal     Neuro/Psych Anxiety negative neurological ROS     GI/Hepatic negative GI ROS, Neg liver ROS,   Endo/Other  negative endocrine ROS  Renal/GU negative Renal ROS     Musculoskeletal   Abdominal   Peds  Hematology negative hematology ROS (+)   Anesthesia Other Findings   Reproductive/Obstetrics negative OB ROS                             Anesthesia Physical  Anesthesia Plan  ASA: II  Anesthesia Plan: General   Post-op Pain Management:    Induction: Intravenous  Airway Management Planned: Oral ETT  Additional Equipment:   Intra-op Plan:   Post-operative Plan: Extubation in OR  Informed Consent: I have reviewed the patients History and Physical, chart, labs and discussed the procedure including the risks, benefits and alternatives for the proposed anesthesia with the patient or authorized representative who has indicated his/her understanding and acceptance.   Dental Advisory Given and Dental advisory given  Plan Discussed with: Anesthesiologist, CRNA and Surgeon  Anesthesia Plan Comments:         Anesthesia Quick Evaluation

## 2016-07-13 NOTE — OR Nursing (Signed)
Patient refused surgery after speaking with Dr Ronita Hipps.

## 2016-07-13 NOTE — Progress Notes (Signed)
Patient ID: Jenny Harrington, female   DOB: Jan 12, 1966, 50 y.o.   MRN: [id number] Patient seen and examined. Consent witnessed and signed. No changes noted. Update completed. Patient has had EAB x 2.  Last EAB in July 2017, had bleeding in August for 2 weeks and scheduled hysterectomy. Upon presentation today, pt notes no bleeding since August and is now happy with ablation results. No pain at this time. CBC nl. Surgery was scheduled for symptomatic fibroids and failed EAB. Upon further discussion, pt elects to cancel surgery.  OR notified.

## 2016-08-31 DIAGNOSIS — Z6832 Body mass index (BMI) 32.0-32.9, adult: Secondary | ICD-10-CM | POA: Diagnosis not present

## 2016-08-31 DIAGNOSIS — E782 Mixed hyperlipidemia: Secondary | ICD-10-CM | POA: Diagnosis not present

## 2016-08-31 DIAGNOSIS — I10 Essential (primary) hypertension: Secondary | ICD-10-CM | POA: Diagnosis not present

## 2016-08-31 DIAGNOSIS — Z1389 Encounter for screening for other disorder: Secondary | ICD-10-CM | POA: Diagnosis not present

## 2016-10-05 DIAGNOSIS — Z6831 Body mass index (BMI) 31.0-31.9, adult: Secondary | ICD-10-CM | POA: Diagnosis not present

## 2016-10-05 DIAGNOSIS — Z1389 Encounter for screening for other disorder: Secondary | ICD-10-CM | POA: Diagnosis not present

## 2016-10-05 DIAGNOSIS — J3089 Other allergic rhinitis: Secondary | ICD-10-CM | POA: Diagnosis not present

## 2016-10-05 DIAGNOSIS — J069 Acute upper respiratory infection, unspecified: Secondary | ICD-10-CM | POA: Diagnosis not present

## 2016-10-05 DIAGNOSIS — E6609 Other obesity due to excess calories: Secondary | ICD-10-CM | POA: Diagnosis not present

## 2016-10-16 ENCOUNTER — Other Ambulatory Visit: Payer: Self-pay | Admitting: Family Medicine

## 2016-10-16 DIAGNOSIS — Z1231 Encounter for screening mammogram for malignant neoplasm of breast: Secondary | ICD-10-CM

## 2016-11-20 ENCOUNTER — Ambulatory Visit
Admission: RE | Admit: 2016-11-20 | Discharge: 2016-11-20 | Disposition: A | Discharge status: Home / Self Care | Payer: BLUE CROSS/BLUE SHIELD | Source: Ambulatory Visit | Attending: Family Medicine | Admitting: Family Medicine

## 2016-11-20 DIAGNOSIS — Z1231 Encounter for screening mammogram for malignant neoplasm of breast: Secondary | ICD-10-CM

## 2017-07-16 DIAGNOSIS — Z01419 Encounter for gynecological examination (general) (routine) without abnormal findings: Secondary | ICD-10-CM | POA: Diagnosis not present

## 2017-07-16 DIAGNOSIS — Z6832 Body mass index (BMI) 32.0-32.9, adult: Secondary | ICD-10-CM | POA: Diagnosis not present

## 2017-07-18 DIAGNOSIS — H524 Presbyopia: Secondary | ICD-10-CM | POA: Diagnosis not present

## 2017-07-18 DIAGNOSIS — Z9889 Other specified postprocedural states: Secondary | ICD-10-CM | POA: Diagnosis not present

## 2017-07-18 DIAGNOSIS — H5213 Myopia, bilateral: Secondary | ICD-10-CM | POA: Diagnosis not present

## 2017-08-02 DIAGNOSIS — Z6831 Body mass index (BMI) 31.0-31.9, adult: Secondary | ICD-10-CM | POA: Diagnosis not present

## 2017-08-02 DIAGNOSIS — E782 Mixed hyperlipidemia: Secondary | ICD-10-CM | POA: Diagnosis not present

## 2017-08-02 DIAGNOSIS — E6609 Other obesity due to excess calories: Secondary | ICD-10-CM | POA: Diagnosis not present

## 2017-08-02 DIAGNOSIS — I10 Essential (primary) hypertension: Secondary | ICD-10-CM | POA: Diagnosis not present

## 2017-11-23 ENCOUNTER — Other Ambulatory Visit: Payer: Self-pay | Admitting: Family Medicine

## 2017-11-23 DIAGNOSIS — Z139 Encounter for screening, unspecified: Secondary | ICD-10-CM

## 2017-12-12 ENCOUNTER — Ambulatory Visit
Admission: RE | Admit: 2017-12-12 | Discharge: 2017-12-12 | Disposition: A | Discharge status: Home / Self Care | Payer: BLUE CROSS/BLUE SHIELD | Source: Ambulatory Visit | Attending: Family Medicine | Admitting: Family Medicine

## 2017-12-12 DIAGNOSIS — Z139 Encounter for screening, unspecified: Secondary | ICD-10-CM

## 2017-12-12 DIAGNOSIS — Z1231 Encounter for screening mammogram for malignant neoplasm of breast: Secondary | ICD-10-CM | POA: Diagnosis not present

## 2018-08-08 DIAGNOSIS — Z6831 Body mass index (BMI) 31.0-31.9, adult: Secondary | ICD-10-CM | POA: Diagnosis not present

## 2018-08-08 DIAGNOSIS — E782 Mixed hyperlipidemia: Secondary | ICD-10-CM | POA: Diagnosis not present

## 2018-08-08 DIAGNOSIS — R05 Cough: Secondary | ICD-10-CM | POA: Diagnosis not present

## 2018-08-08 DIAGNOSIS — E6609 Other obesity due to excess calories: Secondary | ICD-10-CM | POA: Diagnosis not present

## 2018-08-08 DIAGNOSIS — Z1389 Encounter for screening for other disorder: Secondary | ICD-10-CM | POA: Diagnosis not present

## 2018-08-08 DIAGNOSIS — I10 Essential (primary) hypertension: Secondary | ICD-10-CM | POA: Diagnosis not present

## 2018-08-08 DIAGNOSIS — F329 Major depressive disorder, single episode, unspecified: Secondary | ICD-10-CM | POA: Diagnosis not present

## 2018-08-13 DIAGNOSIS — D259 Leiomyoma of uterus, unspecified: Secondary | ICD-10-CM | POA: Diagnosis not present

## 2018-08-13 DIAGNOSIS — N920 Excessive and frequent menstruation with regular cycle: Secondary | ICD-10-CM | POA: Diagnosis not present

## 2018-08-20 DIAGNOSIS — D259 Leiomyoma of uterus, unspecified: Secondary | ICD-10-CM | POA: Diagnosis not present

## 2018-09-16 ENCOUNTER — Other Ambulatory Visit: Payer: Self-pay | Admitting: Obstetrics and Gynecology

## 2018-09-27 ENCOUNTER — Encounter (HOSPITAL_COMMUNITY): Payer: Self-pay

## 2018-09-27 NOTE — Patient Instructions (Addendum)
Jenny Harrington  09/27/2018   Your procedure is scheduled on: 10-08-18  Report to [facility] Main  Entrance   Report to admitting at      1100 AM    Call this number if you have problems the morning of surgery [phone]    Remember: Do not eat food  :After Midnight. You may have clear liquids until 0700 am then nothing by mouth    CLEAR LIQUID DIET   Foods Allowed                                                                     Foods Excluded  Coffee and tea, regular and decaf                             liquids that you cannot  Plain Jell-O in any flavor                                             see through such as: Fruit ices (not with fruit pulp)                                     milk, soups, orange juice  Iced Popsicles                                    All solid food Carbonated beverages, regular and diet                                    Cranberry, grape and apple juices Sports drinks like Gatorade Lightly seasoned clear broth or consume(fat free) Sugar, honey syrup    _____________________________________________________________________   BRUSH YOUR TEETH MORNING OF SURGERY AND RINSE YOUR MOUTH OUT, NO CHEWING GUM CANDY OR MINTS.     Take these medicines the morning of surgery with A SIP OF WATER: zocor, celexa, flonase, zyrtec                               You may not have any metal on your body including hair pins and              piercings  Do not wear jewelry, make-up, lotions, powders or perfumes, deodorant             Do not wear nail polish.  Do not shave  48 hours prior to surgery.     Do not bring valuables to the hospital. [place].  Contacts, dentures or bridgework may not be worn into surgery.  Leave suitcase in the car. After surgery it may be brought to  your room.   Name and phone number of your driver:  Special Instructions: N/A              Please read over  the following fact sheets you were given: _____________________________________________________________________           [facility] - Preparing for Surgery Before surgery, you can play an important role.  Because skin is not sterile, your skin needs to be as free of germs as possible.  You can reduce the number of germs on your skin by washing with CHG (chlorahexidine gluconate) soap before surgery.  CHG is an antiseptic cleaner which kills germs and bonds with the skin to continue killing germs even after washing. Please DO NOT use if you have an allergy to CHG or antibacterial soaps.  If your skin becomes reddened/irritated stop using the CHG and inform your nurse when you arrive at Short Stay. Do not shave (including legs and underarms) for at least 48 hours prior to the first CHG shower.  You may shave your face/neck. Please follow these instructions carefully:  1.  Shower with CHG Soap the night before surgery and the  morning of Surgery.  2.  If you choose to wash your hair, wash your hair first as usual with your  normal  shampoo.  3.  After you shampoo, rinse your hair and body thoroughly to remove the  shampoo.                           4.  Use CHG as you would any other liquid soap.  You can apply chg directly  to the skin and wash                       Gently with a scrungie or clean washcloth.  5.  Apply the CHG Soap to your body ONLY FROM THE NECK DOWN.   Do not use on face/ open                           Wound or open sores. Avoid contact with eyes, ears mouth and genitals (private parts).                       Wash face,  Genitals (private parts) with your normal soap.             6.  Wash thoroughly, paying special attention to the area where your surgery  will be performed.  7.  Thoroughly rinse your body with warm water from the neck down.  8.  DO NOT shower/wash with your normal soap after using and rinsing off  the CHG Soap.                9.  Pat yourself dry with a clean  towel.            10.  Wear clean pajamas.            11.  Place clean sheets on your bed the night of your first shower and do not  sleep with pets. Day of Surgery : Do not apply any lotions/deodorants the morning of surgery.  Please wear clean clothes to the hospital/surgery center.  FAILURE TO FOLLOW THESE INSTRUCTIONS MAY RESULT IN THE CANCELLATION OF YOUR SURGERY PATIENT SIGNATURE_________________________________  NURSE SIGNATURE__________________________________  ________________________________________________________________________

## 2018-10-01 ENCOUNTER — Encounter (HOSPITAL_COMMUNITY): Payer: Self-pay

## 2018-10-01 ENCOUNTER — Other Ambulatory Visit: Payer: Self-pay

## 2018-10-01 ENCOUNTER — Encounter (HOSPITAL_COMMUNITY)
Admission: RE | Admit: 2018-10-01 | Discharge: 2018-10-01 | Disposition: A | Discharge status: Home / Self Care | Payer: BLUE CROSS/BLUE SHIELD | Source: Ambulatory Visit | Attending: Obstetrics and Gynecology | Admitting: Obstetrics and Gynecology

## 2018-10-01 DIAGNOSIS — Z01818 Encounter for other preprocedural examination: Secondary | ICD-10-CM | POA: Diagnosis not present

## 2018-10-01 HISTORY — DX: Pneumonia, unspecified organism: J18.9

## 2018-10-01 LAB — BASIC METABOLIC PANEL
Anion gap: 7 (ref 5–15)
BUN: 17 mg/dL (ref 6–20)
CALCIUM: 9.2 mg/dL (ref 8.9–10.3)
CO2: 26 mmol/L (ref 22–32)
Chloride: 107 mmol/L (ref 98–111)
Creatinine, Ser: 0.81 mg/dL (ref 0.44–1.00)
GFR calc Af Amer: >60 mL/min (ref >60)
Glucose, Bld: 84 mg/dL (ref 70–99)
Potassium: 4.5 mmol/L (ref 3.5–5.1)
SODIUM: 140 mmol/L (ref 135–145)

## 2018-10-01 LAB — CBC
HCT: 38.3 % (ref 36.0–46.0)
Hemoglobin: 11.9 g/dL — ABNORMAL LOW (ref 12.0–15.0)
MCH: 28.3 pg (ref 26.0–34.0)
MCHC: 31.1 g/dL (ref 30.0–36.0)
MCV: 91 fL (ref 80.0–100.0)
NRBC: 0 % (ref 0.0–0.2)
Platelets: 428 10*3/uL — ABNORMAL HIGH (ref 150–400)
RBC: 4.21 MIL/uL (ref 3.87–5.11)
RDW: 14.7 % (ref 11.5–15.5)
WBC: 8.3 10*3/uL (ref 4.0–10.5)

## 2018-10-08 ENCOUNTER — Ambulatory Visit (HOSPITAL_BASED_OUTPATIENT_CLINIC_OR_DEPARTMENT_OTHER): Payer: BLUE CROSS/BLUE SHIELD | Admitting: Physician Assistant

## 2018-10-08 ENCOUNTER — Ambulatory Visit (HOSPITAL_BASED_OUTPATIENT_CLINIC_OR_DEPARTMENT_OTHER)
Admission: RE | Admit: 2018-10-08 | Discharge: 2018-10-09 | Disposition: A | Discharge status: Home / Self Care | Payer: BLUE CROSS/BLUE SHIELD | Source: Ambulatory Visit | Attending: Obstetrics and Gynecology | Admitting: Obstetrics and Gynecology

## 2018-10-08 ENCOUNTER — Ambulatory Visit (HOSPITAL_BASED_OUTPATIENT_CLINIC_OR_DEPARTMENT_OTHER): Payer: BLUE CROSS/BLUE SHIELD | Admitting: Anesthesiology

## 2018-10-08 ENCOUNTER — Encounter (HOSPITAL_COMMUNITY): Payer: Self-pay

## 2018-10-08 ENCOUNTER — Encounter (HOSPITAL_COMMUNITY)
Admission: RE | Disposition: A | Discharge status: Home / Self Care | Payer: Self-pay | Source: Ambulatory Visit | Attending: Obstetrics and Gynecology

## 2018-10-08 DIAGNOSIS — Z79899 Other long term (current) drug therapy: Secondary | ICD-10-CM | POA: Insufficient documentation

## 2018-10-08 DIAGNOSIS — D259 Leiomyoma of uterus, unspecified: Secondary | ICD-10-CM | POA: Diagnosis not present

## 2018-10-08 DIAGNOSIS — N815 Vaginal enterocele: Secondary | ICD-10-CM | POA: Diagnosis not present

## 2018-10-08 DIAGNOSIS — N800 Endometriosis of uterus: Secondary | ICD-10-CM | POA: Diagnosis not present

## 2018-10-08 DIAGNOSIS — Z803 Family history of malignant neoplasm of breast: Secondary | ICD-10-CM | POA: Diagnosis not present

## 2018-10-08 DIAGNOSIS — D252 Subserosal leiomyoma of uterus: Secondary | ICD-10-CM | POA: Insufficient documentation

## 2018-10-08 DIAGNOSIS — I10 Essential (primary) hypertension: Secondary | ICD-10-CM | POA: Diagnosis not present

## 2018-10-08 DIAGNOSIS — N938 Other specified abnormal uterine and vaginal bleeding: Secondary | ICD-10-CM | POA: Diagnosis not present

## 2018-10-08 DIAGNOSIS — Z87891 Personal history of nicotine dependence: Secondary | ICD-10-CM | POA: Diagnosis not present

## 2018-10-08 DIAGNOSIS — Z7982 Long term (current) use of aspirin: Secondary | ICD-10-CM | POA: Insufficient documentation

## 2018-10-08 DIAGNOSIS — Z793 Long term (current) use of hormonal contraceptives: Secondary | ICD-10-CM | POA: Diagnosis not present

## 2018-10-08 DIAGNOSIS — D219 Benign neoplasm of connective and other soft tissue, unspecified: Secondary | ICD-10-CM | POA: Diagnosis present

## 2018-10-08 DIAGNOSIS — F419 Anxiety disorder, unspecified: Secondary | ICD-10-CM | POA: Diagnosis not present

## 2018-10-08 DIAGNOSIS — N9971 Accidental puncture and laceration of a genitourinary system organ or structure during a genitourinary system procedure: Secondary | ICD-10-CM | POA: Diagnosis not present

## 2018-10-08 DIAGNOSIS — S3141XA Laceration without foreign body of vagina and vulva, initial encounter: Secondary | ICD-10-CM | POA: Diagnosis not present

## 2018-10-08 DIAGNOSIS — E785 Hyperlipidemia, unspecified: Secondary | ICD-10-CM | POA: Insufficient documentation

## 2018-10-08 DIAGNOSIS — N736 Female pelvic peritoneal adhesions (postinfective): Secondary | ICD-10-CM | POA: Insufficient documentation

## 2018-10-08 HISTORY — PX: ROBOTIC ASSISTED LAPAROSCOPIC HYSTERECTOMY AND SALPINGECTOMY: SHX6379

## 2018-10-08 SURGERY — XI ROBOTIC ASSISTED LAPAROSCOPIC HYSTERECTOMY AND SALPINGECTOMY
Anesthesia: General | Laterality: Bilateral

## 2018-10-08 MED ORDER — ARTIFICIAL TEARS OPHTHALMIC OINT
TOPICAL_OINTMENT | OPHTHALMIC | Status: AC
  Filled 2018-10-08: qty 3.5

## 2018-10-08 MED ORDER — TRAMADOL HCL 50 MG PO TABS: 50.0000 mg | ORAL_TABLET | Freq: Four times a day (QID) | ORAL | Status: DC | PRN

## 2018-10-08 MED ORDER — KETOROLAC TROMETHAMINE 30 MG/ML IJ SOLN
INTRAMUSCULAR | Status: DC | PRN
  Administered 2018-10-08: 30 mg via INTRAVENOUS

## 2018-10-08 MED ORDER — LIDOCAINE 2% (20 MG/ML) 5 ML SYRINGE
INTRAMUSCULAR | Status: DC | PRN
  Administered 2018-10-08: 40 mg via INTRAVENOUS

## 2018-10-08 MED ORDER — EPHEDRINE 5 MG/ML INJ
INTRAVENOUS | Status: AC
  Filled 2018-10-08: qty 10

## 2018-10-08 MED ORDER — DIPHENHYDRAMINE HCL 12.5 MG/5ML PO ELIX: 12.5000 mg | ORAL_SOLUTION | Freq: Four times a day (QID) | ORAL | Status: DC | PRN

## 2018-10-08 MED ORDER — ONDANSETRON HCL 4 MG/2ML IJ SOLN: 4.0000 mg | Freq: Four times a day (QID) | INTRAMUSCULAR | Status: DC | PRN

## 2018-10-08 MED ORDER — SUGAMMADEX SODIUM 500 MG/5ML IV SOLN
INTRAVENOUS | Status: AC
  Filled 2018-10-08: qty 5

## 2018-10-08 MED ORDER — HYDROMORPHONE HCL 1 MG/ML IJ SOLN
0.2500 mg | INTRAMUSCULAR | Status: DC | PRN
  Administered 2018-10-08 (×2): 0.5 mg via INTRAVENOUS

## 2018-10-08 MED ORDER — HYDROMORPHONE 1 MG/ML IV SOLN
INTRAVENOUS | Status: DC
  Filled 2018-10-08 (×5): qty 30

## 2018-10-08 MED ORDER — ROCURONIUM BROMIDE 10 MG/ML (PF) SYRINGE
PREFILLED_SYRINGE | INTRAVENOUS | Status: AC
  Filled 2018-10-08: qty 10

## 2018-10-08 MED ORDER — ROCURONIUM BROMIDE 10 MG/ML (PF) SYRINGE
PREFILLED_SYRINGE | INTRAVENOUS | Status: DC | PRN
  Administered 2018-10-08: 20 mg via INTRAVENOUS
  Administered 2018-10-08: 50 mg via INTRAVENOUS
  Administered 2018-10-08: 20 mg via INTRAVENOUS

## 2018-10-08 MED ORDER — ONDANSETRON HCL 4 MG/2ML IJ SOLN
INTRAMUSCULAR | Status: AC
  Filled 2018-10-08: qty 2

## 2018-10-08 MED ORDER — ACETAMINOPHEN 10 MG/ML IV SOLN
INTRAVENOUS | Status: DC | PRN
  Administered 2018-10-08: 1000 mg via INTRAVENOUS

## 2018-10-08 MED ORDER — FENTANYL CITRATE (PF) 100 MCG/2ML IJ SOLN
50.0000 ug | INTRAMUSCULAR | Status: AC | PRN
  Administered 2018-10-08: 50 ug via INTRAVENOUS
  Administered 2018-10-08: 25 ug via INTRAVENOUS
  Administered 2018-10-08 (×6): 50 ug via INTRAVENOUS

## 2018-10-08 MED ORDER — SODIUM CHLORIDE 0.9 % IV SOLN
INTRAVENOUS | Status: DC | PRN
  Administered 2018-10-08: 60 mL

## 2018-10-08 MED ORDER — LIDOCAINE 2% (20 MG/ML) 5 ML SYRINGE
INTRAMUSCULAR | Status: AC
  Filled 2018-10-08: qty 5

## 2018-10-08 MED ORDER — LACTATED RINGERS IV SOLN: INTRAVENOUS | Status: DC

## 2018-10-08 MED ORDER — PROPOFOL 10 MG/ML IV BOLUS
INTRAVENOUS | Status: AC
  Filled 2018-10-08: qty 40

## 2018-10-08 MED ORDER — FENTANYL CITRATE (PF) 250 MCG/5ML IJ SOLN
INTRAMUSCULAR | Status: AC
  Filled 2018-10-08: qty 5

## 2018-10-08 MED ORDER — SIMVASTATIN 10 MG PO TABS
10.0000 mg | ORAL_TABLET | Freq: Every day | ORAL | Status: DC
  Filled 2018-10-08: qty 1

## 2018-10-08 MED ORDER — EPHEDRINE SULFATE-NACL 50-0.9 MG/10ML-% IV SOSY
PREFILLED_SYRINGE | INTRAVENOUS | Status: DC | PRN
  Administered 2018-10-08: 10 mg via INTRAVENOUS
  Administered 2018-10-08: 15 mg via INTRAVENOUS

## 2018-10-08 MED ORDER — DEXTROSE IN LACTATED RINGERS 5 % IV SOLN
INTRAVENOUS | Status: DC
  Administered 2018-10-08: 18:00:00 via INTRAVENOUS

## 2018-10-08 MED ORDER — LOSARTAN POTASSIUM 50 MG PO TABS
50.0000 mg | ORAL_TABLET | Freq: Every day | ORAL | Status: DC
  Administered 2018-10-08: 50 mg via ORAL
  Filled 2018-10-08: qty 1

## 2018-10-08 MED ORDER — NALOXONE HCL 0.4 MG/ML IJ SOLN: 0.4000 mg | INTRAMUSCULAR | Status: DC | PRN

## 2018-10-08 MED ORDER — KETOROLAC TROMETHAMINE 30 MG/ML IJ SOLN
INTRAMUSCULAR | Status: AC
  Filled 2018-10-08: qty 1

## 2018-10-08 MED ORDER — SUGAMMADEX SODIUM 200 MG/2ML IV SOLN
INTRAVENOUS | Status: DC | PRN
  Administered 2018-10-08: 300 mg via INTRAVENOUS
  Administered 2018-10-08: 200 mg via INTRAVENOUS

## 2018-10-08 MED ORDER — MEPERIDINE HCL 50 MG/ML IJ SOLN: 6.2500 mg | INTRAMUSCULAR | Status: DC | PRN

## 2018-10-08 MED ORDER — HYDROMORPHONE HCL 1 MG/ML IJ SOLN
INTRAMUSCULAR | Status: AC
  Filled 2018-10-08: qty 1

## 2018-10-08 MED ORDER — DEXAMETHASONE SODIUM PHOSPHATE 10 MG/ML IJ SOLN
INTRAMUSCULAR | Status: AC
  Filled 2018-10-08: qty 1

## 2018-10-08 MED ORDER — LIDOCAINE 2% (20 MG/ML) 5 ML SYRINGE
INTRAMUSCULAR | Status: DC | PRN
  Administered 2018-10-08: 1.5 mg/kg/h via INTRAVENOUS

## 2018-10-08 MED ORDER — MIDAZOLAM HCL 2 MG/2ML IJ SOLN
INTRAMUSCULAR | Status: DC | PRN
  Administered 2018-10-08: 2 mg via INTRAVENOUS

## 2018-10-08 MED ORDER — OXYCODONE-ACETAMINOPHEN 5-325 MG PO TABS
1.0000 | ORAL_TABLET | ORAL | Status: DC | PRN
  Administered 2018-10-08 – 2018-10-09 (×3): 1 via ORAL
  Filled 2018-10-08 (×3): qty 1

## 2018-10-08 MED ORDER — SUGAMMADEX SODIUM 200 MG/2ML IV SOLN
INTRAVENOUS | Status: AC
  Filled 2018-10-08: qty 2

## 2018-10-08 MED ORDER — SODIUM CHLORIDE 0.9% FLUSH: 9.0000 mL | INTRAVENOUS | Status: DC | PRN

## 2018-10-08 MED ORDER — DEXAMETHASONE SODIUM PHOSPHATE 10 MG/ML IJ SOLN
INTRAMUSCULAR | Status: DC | PRN
  Administered 2018-10-08: 10 mg via INTRAVENOUS

## 2018-10-08 MED ORDER — BUPIVACAINE HCL (PF) 0.25 % IJ SOLN
INTRAMUSCULAR | Status: DC | PRN
  Administered 2018-10-08: 30 mL

## 2018-10-08 MED ORDER — PROMETHAZINE HCL 25 MG/ML IJ SOLN: 6.2500 mg | INTRAMUSCULAR | Status: DC | PRN

## 2018-10-08 MED ORDER — DIPHENHYDRAMINE HCL 50 MG/ML IJ SOLN: 12.5000 mg | Freq: Four times a day (QID) | INTRAMUSCULAR | Status: DC | PRN

## 2018-10-08 MED ORDER — LACTATED RINGERS IV SOLN
INTRAVENOUS | Status: DC
  Administered 2018-10-08: 14:00:00 via INTRAVENOUS
  Administered 2018-10-08: 1000 mL via INTRAVENOUS

## 2018-10-08 MED ORDER — ONDANSETRON HCL 4 MG/2ML IJ SOLN
INTRAMUSCULAR | Status: DC | PRN
  Administered 2018-10-08 (×2): 4 mg via INTRAVENOUS

## 2018-10-08 MED ORDER — ACETAMINOPHEN 10 MG/ML IV SOLN
INTRAVENOUS | Status: AC
  Filled 2018-10-08: qty 100

## 2018-10-08 MED ORDER — CEFAZOLIN SODIUM-DEXTROSE 2-4 GM/100ML-% IV SOLN
INTRAVENOUS | Status: AC
  Filled 2018-10-08: qty 100

## 2018-10-08 MED ORDER — SCOPOLAMINE 1 MG/3DAYS TD PT72
MEDICATED_PATCH | TRANSDERMAL | Status: DC | PRN
  Administered 2018-10-08: 1 via TRANSDERMAL

## 2018-10-08 MED ORDER — SCOPOLAMINE 1 MG/3DAYS TD PT72
MEDICATED_PATCH | TRANSDERMAL | Status: AC
  Filled 2018-10-08: qty 1

## 2018-10-08 MED ORDER — PROPOFOL 10 MG/ML IV BOLUS
INTRAVENOUS | Status: DC | PRN
  Administered 2018-10-08: 50 mg via INTRAVENOUS
  Administered 2018-10-08: 150 mg via INTRAVENOUS

## 2018-10-08 MED ORDER — FENTANYL CITRATE (PF) 100 MCG/2ML IJ SOLN
INTRAMUSCULAR | Status: AC
  Filled 2018-10-08: qty 2

## 2018-10-08 MED ORDER — MIDAZOLAM HCL 2 MG/2ML IJ SOLN
INTRAMUSCULAR | Status: AC
  Filled 2018-10-08: qty 2

## 2018-10-08 SURGICAL SUPPLY — 52 items
BARRIER ADHS 3X4 INTERCEED (GAUZE/BANDAGES/DRESSINGS) IMPLANT
CANISTER SUCT 3000ML PPV (MISCELLANEOUS) ×2 IMPLANT
CATH FOLEY 3WAY  5CC 16FR (CATHETERS) ×1
CATH FOLEY 3WAY 5CC 16FR (CATHETERS) ×1 IMPLANT
COVER BACK TABLE 60X90IN (DRAPES) ×2 IMPLANT
COVER TIP SHEARS 8 DVNC (MISCELLANEOUS) ×1 IMPLANT
COVER TIP SHEARS 8MM DA VINCI (MISCELLANEOUS) ×1
DECANTER SPIKE VIAL GLASS SM (MISCELLANEOUS) ×2 IMPLANT
DEFOGGER SCOPE WARMER CLEARIFY (MISCELLANEOUS) ×2 IMPLANT
DERMABOND ADVANCED (GAUZE/BANDAGES/DRESSINGS) ×1
DERMABOND ADVANCED .7 DNX12 (GAUZE/BANDAGES/DRESSINGS) ×1 IMPLANT
DRAPE ARM DVNC X/XI (DISPOSABLE) ×4 IMPLANT
DRAPE COLUMN DVNC XI (DISPOSABLE) ×1 IMPLANT
DRAPE DA VINCI XI ARM (DISPOSABLE) ×4
DRAPE DA VINCI XI COLUMN (DISPOSABLE) ×1
DURAPREP 26ML APPLICATOR (WOUND CARE) ×2 IMPLANT
ELECT REM PT RETURN 9FT ADLT (ELECTROSURGICAL) ×2
ELECTRODE REM PT RTRN 9FT ADLT (ELECTROSURGICAL) ×1 IMPLANT
GLOVE BIO SURGEON STRL SZ7.5 (GLOVE) ×6 IMPLANT
GLOVE BIOGEL PI IND STRL 7.0 (GLOVE) ×2 IMPLANT
GLOVE BIOGEL PI INDICATOR 7.0 (GLOVE) ×2
IRRIG SUCT STRYKERFLOW 2 WTIP (MISCELLANEOUS) ×2
IRRIGATION SUCT STRKRFLW 2 WTP (MISCELLANEOUS) ×1 IMPLANT
NEEDLE INSUFFLATION 150MM (ENDOMECHANICALS) ×2 IMPLANT
OBTURATOR OPTICAL STANDARD 8MM (TROCAR)
OBTURATOR OPTICAL STND 8 DVNC (TROCAR)
OBTURATOR OPTICALSTD 8 DVNC (TROCAR) IMPLANT
OCCLUDER COLPOPNEUMO (BALLOONS) ×2 IMPLANT
PACK ROBOT WH (CUSTOM PROCEDURE TRAY) ×2 IMPLANT
PACK ROBOTIC GOWN (GOWN DISPOSABLE) ×2 IMPLANT
PACK TRENDGUARD 450 HYBRID PRO (MISCELLANEOUS) IMPLANT
PAD PREP 24X48 CUFFED NSTRL (MISCELLANEOUS) ×2 IMPLANT
PROTECTOR NERVE ULNAR (MISCELLANEOUS) ×4 IMPLANT
SEAL CANN UNIV 5-8 DVNC XI (MISCELLANEOUS) ×3 IMPLANT
SEAL XI 5MM-8MM UNIVERSAL (MISCELLANEOUS) ×3
SET CYSTO W/LG BORE CLAMP LF (SET/KITS/TRAYS/PACK) IMPLANT
SET TRI-LUMEN FLTR TB AIRSEAL (TUBING) ×2 IMPLANT
SUT VIC AB 0 CT1 27 (SUTURE) ×2
SUT VIC AB 0 CT1 27XBRD ANBCTR (SUTURE) ×2 IMPLANT
SUT VICRYL 0 UR6 27IN ABS (SUTURE) ×2 IMPLANT
SUT VICRYL RAPIDE 4/0 PS 2 (SUTURE) ×4 IMPLANT
SUT VLOC 180 0 9IN  GS21 (SUTURE) ×1
SUT VLOC 180 0 9IN GS21 (SUTURE) IMPLANT
TIP RUMI ORANGE 6.7MMX12CM (TIP) IMPLANT
TIP UTERINE 5.1X6CM LAV DISP (MISCELLANEOUS) IMPLANT
TIP UTERINE 6.7X10CM GRN DISP (MISCELLANEOUS) IMPLANT
TIP UTERINE 6.7X6CM WHT DISP (MISCELLANEOUS) IMPLANT
TIP UTERINE 6.7X8CM BLUE DISP (MISCELLANEOUS) IMPLANT
TOWEL OR 17X24 6PK STRL BLUE (TOWEL DISPOSABLE) ×4 IMPLANT
TRENDGUARD 450 HYBRID PRO PACK (MISCELLANEOUS) ×2
TROCAR PORT AIRSEAL 8X120 (TROCAR) ×2 IMPLANT
WATER STERILE IRR 1000ML POUR (IV SOLUTION) ×2 IMPLANT

## 2018-10-08 NOTE — H&P (Signed)
Jenny Harrington is an 53 y.o. female. With failied ablation for definitive surgery HP dictated 1/13  Pertinent Gynecological History: Menses: flow is moderate Bleeding: dysfunctional uterine bleeding Contraception: none DES exposure: denies Blood transfusions: none Sexually transmitted diseases: no past history Previous GYN Procedures: na  Last mammogram: normal Date: na Last pap: normal Date: na OB History: G0, P0   Menstrual History: Menarche age: 20 Patient's last menstrual period was 08/26/2018.    Past Medical History:  Diagnosis Date  . Anxiety    pt. denies  . Hyperlipidemia   . Hypertension   . Pneumonia   . Seasonal allergies     Past Surgical History:  Procedure Laterality Date  . ABDOMINAL HYSTERECTOMY     Dr. Ronita Hipps 10-08-18  . DILITATION & CURRETTAGE/HYSTROSCOPY WITH HYDROTHERMAL ABLATION N/A 04/11/2016   Procedure: DILATATION & CURETTAGE/HYSTEROSCOPY WITH HYDROTHERMAL ABLATION;  Surgeon: Brien Few, MD;  Location: [facility];  Service: Gynecology;  Laterality: N/A;  . EYE SURGERY     lasik - bilateral  . NOVASURE ABLATION     done in MD's office  . TONSILLECTOMY    . WISDOM TOOTH EXTRACTION      Family History  Problem Relation Age of Onset  . Breast cancer Neg Hx     Social History:  reports that she quit smoking about 13 years ago. Her smoking use included cigarettes. She has a 1.50 pack-year smoking history. She has never used smokeless tobacco. She reports current alcohol use. She reports that she does not use drugs.  Allergies: No Known Allergies  Medications Prior to Admission  Medication Sig Dispense Refill Last Dose  . aspirin 81 MG tablet Take 81 mg by mouth daily.   09/17/2018  . cetirizine (ZYRTEC) 10 MG tablet Take 10 mg by mouth daily.   10/08/2018 at [place]  . citalopram (CELEXA) 10 MG tablet Take 10 mg by mouth daily.   10/08/2018 at [place]  . fluticasone (FLONASE) 50 MCG/ACT nasal spray Place 2 sprays into the nose daily as needed for  allergies.    10/07/2018 at Unknown time  . losartan (COZAAR) 50 MG tablet Take 50 mg by mouth daily.   10/07/2018 at Unknown time  . Multiple Vitamin (MULTIVITAMIN WITH MINERALS) TABS tablet Take 1 tablet by mouth daily.   10/08/2018 at [place]  . OVER THE COUNTER MEDICATION Take 2 tablets by mouth daily. Patient takes ALJ herbal supplement   10/08/2018 at 0645  . simvastatin (ZOCOR) 10 MG tablet Take 10 mg by mouth daily.   10/08/2018 at [place]  . norethindrone (AYGESTIN) 5 MG tablet Take 5 mg by mouth as needed.   10/01/2018    Review of Systems  Constitutional: Negative.   All other systems reviewed and are negative.   Blood pressure (!) 147/84, pulse (P) 79, temperature 99.5 F (37.5 C), temperature source Oral, resp. rate 14, height 5\' 1"  (1.549 m), weight 74.8 kg, last menstrual period 08/26/2018, SpO2 (P) 98 %. Physical Exam  Nursing note and vitals reviewed. Constitutional: She is oriented to person, place, and time. She appears well-developed and well-nourished.  Neck: Normal range of motion. Neck supple.  Cardiovascular: Normal rate and regular rhythm.  Respiratory: Breath sounds normal.  GI: Soft. Bowel sounds are normal.  Genitourinary:    Vagina and uterus normal.   Musculoskeletal: Normal range of motion.  Neurological: She is alert and oriented to person, place, and time. She has normal reflexes.  Skin: Skin is warm and dry.  Psychiatric: She has  a normal mood and affect.    No results found for this or any previous visit (from the past 24 hour(s)).  No results found.  Assessment/Plan: Failed endometrial ablation with AUB Davinci TLH and bilateral salpingectomy Consent done. Possible TAH. HP dictated but lost by Dictation service.  Briyonna Omara J 10/08/2018, 12:50 PM

## 2018-10-08 NOTE — Anesthesia Preprocedure Evaluation (Addendum)
Anesthesia Evaluation  Patient identified by MRN, date of birth, ID band Patient awake    Reviewed: Allergy & Precautions, NPO status , Patient's Chart, lab work & pertinent test results  Airway Mallampati: II  TM Distance: >3 FB Neck ROM: Full  Mouth opening: Limited Mouth Opening  Dental  (+) Teeth Intact, Dental Advisory Given   Pulmonary former smoker,    breath sounds clear to auscultation       Cardiovascular hypertension, Pt. on medications  Rhythm:Regular Rate:Normal     Neuro/Psych Anxiety    GI/Hepatic negative GI ROS, Neg liver ROS,   Endo/Other  negative endocrine ROS  Renal/GU negative Renal ROS     Musculoskeletal negative musculoskeletal ROS (+)   Abdominal Normal abdominal exam  (+)   Peds  Hematology negative hematology ROS (+)   Anesthesia Other Findings - HLD  Reproductive/Obstetrics                            Anesthesia Physical Anesthesia Plan  ASA: II  Anesthesia Plan: General   Post-op Pain Management:    Induction: Intravenous  PONV Risk Score and Plan: 4 or greater and Ondansetron, Dexamethasone, Midazolam and Scopolamine patch - Pre-op  Airway Management Planned: Oral ETT  Additional Equipment: None  Intra-op Plan:   Post-operative Plan: Extubation in OR  Informed Consent: I have reviewed the patients History and Physical, chart, labs and discussed the procedure including the risks, benefits and alternatives for the proposed anesthesia with the patient or authorized representative who has indicated his/her understanding and acceptance.     Dental advisory given  Plan Discussed with: CRNA  Anesthesia Plan Comments:        Anesthesia Quick Evaluation

## 2018-10-08 NOTE — Progress Notes (Signed)
Patient got up to the floor. She was complaining of no nausea. Pain was a 1 out of 10. She stated she thought she would be okay with just pain pills. Dr. Ronita Hipps called and asked if this RN could just go ahead and start patient on pain pills and not set up the PCA pump. He stated that that would be okay.

## 2018-10-08 NOTE — Progress Notes (Signed)
Patient seen and examined. Consent witnessed and signed. No changes noted. Update completed. BP (!) 147/84   Pulse (P) 79 Comment: 98  Temp 99.5 F (37.5 C) (Oral)   Resp 14   Ht 5\' 1"  (1.549 m)   Wt 74.8 kg   LMP 08/26/2018   SpO2 (P) 98%   BMI 31.18 kg/m   CBC    Component Value Date/Time   WBC 8.3 10/01/2018 0925   RBC 4.21 10/01/2018 0925   HGB 11.9 (L) 10/01/2018 0925   HCT 38.3 10/01/2018 0925   PLT 428 (H) 10/01/2018 0925   MCV 91.0 10/01/2018 0925   MCH 28.3 10/01/2018 0925   MCHC 31.1 10/01/2018 0925   RDW 14.7 10/01/2018 0925

## 2018-10-08 NOTE — Anesthesia Procedure Notes (Addendum)
Procedure Name: Intubation Date/Time: 10/08/2018 1:07 PM Performed by: Effie Berkshire, MD Pre-anesthesia Checklist: Patient identified, Emergency Drugs available, Suction available and Patient being monitored Patient Re-evaluated:Patient Re-evaluated prior to induction Oxygen Delivery Method: Circle system utilized Preoxygenation: Pre-oxygenation with 100% oxygen Induction Type: IV induction Ventilation: Mask ventilation without difficulty Laryngoscope Size: Mac and 3 Grade View: Grade I Tube type: Oral Tube size: 7.0 mm Number of attempts: 1 Airway Equipment and Method: Stylet Placement Confirmation: ETT inserted through vocal cords under direct vision,  positive ETCO2 and breath sounds checked- equal and bilateral Secured at: 21 cm Tube secured with: Tape Dental Injury: Teeth and Oropharynx as per pre-operative assessment

## 2018-10-08 NOTE — Op Note (Signed)
10/08/2018  3:57 PM  PATIENT:  Jenny Harrington  53 y.o. female  PRE-OPERATIVE DIAGNOSIS:  Symptomatic Fibroid, Uterine Enlargement  POST-OPERATIVE DIAGNOSIS:  Symptomatic Fibroid, Uterine Enlargement,Enterocele, pelvic adhesions  PROCEDURE:  Procedure(s): XI ROBOTIC ASSISTED LAPAROSCOPIC HYSTERECTOMY  BILATERAL  SALPINGECTOMY LYSIS OF SIGMOID ADHESIONS, LYSIS OF ANTERIOR ABDOMINAL WALL ADHESIONS MCCALL CUL DE PLASTY  SURGEON:  Surgeon(s): Brien Few, MD Azucena Fallen, MD  ASSISTANTS: MODY, MD   ANESTHESIA:   local and general  ESTIMATED BLOOD LOSS: 50 mL   DRAINS: Urinary Catheter (Foley)   LOCAL MEDICATIONS USED:  MARCAINE    and Amount: 20 ml  SPECIMEN:  Source of Specimen:  UTERUS, CERVIX, TUBES , FIBROIDS  DISPOSITION OF SPECIMEN:  PATHOLOGY  COUNTS:  YES  DICTATION #: [id number]  PLAN OF CARE: 23 HR OBSERVATION  PATIENT DISPOSITION:  PACU - hemodynamically stable.

## 2018-10-08 NOTE — Op Note (Signed)
NAME: Jenny Harrington, Jenny Harrington MEDICAL RECORD [id number] ACCOUNT [id number] DATE OF BIRTH:[birth year] FACILITY: [facility] LOCATION: [facility] PHYSICIAN:Jeselle Hiser J. Zymere Patlan, MD  OPERATIVE REPORT  DATE OF PROCEDURE:  10/08/2018  PREOPERATIVE DIAGNOSES:  Failed endometrial ablation, symptomatic fibroids, abnormal uterine bleeding.  POSTOPERATIVE DIAGNOSES:  Failed endometrial ablation, symptomatic fibroids, abnormal uterine bleeding, pelvic adhesions, anterior abdominal wall adhesions, vaginal laceration with removal of specimen and enterocele.  PROCEDURE:  Robotically assisted laparoscopic total hysterectomy, bilateral salpingectomy, lysis of sigmoid to left adnexal adhesions, lysis of anterior abdominal wall adhesions, McCall culdoplasty, repair of vaginal laceration.  SURGEON:  Brien Few, MD  ASSISTANTLisbeth Renshaw  ANESTHESIA:  General and local.  ESTIMATED BLOOD LOSS:  50 mL.  COMPLICATIONS:  None.  DRAINS:  Foley.  COUNTS:  Correct.  DISPOSITION:  The patient was taken to the recovery room in good condition.  DESCRIPTION OF PROCEDURE:  After being apprised of the risks of anesthesia, infection, bleeding, injury to surrounding organs, possible need for repair, delayed versus immediate complications to include bowel and bladder injury, possible need for repair,  the patient was brought to the operating room where she was administered general anesthetic without complications, prepped and draped in the usual sterile fashion, Foley catheter placed.  RUMI retractor placed vaginally without difficulty.   Infraumbilical incision was made with a scalpel.  Veress needle placed.  Opening pressure -2.  CO2 of 3.5 L insufflated without difficulty.  Trocar placed atraumatically.  Pictures were taken.  Normal liver, gallbladder bed, normal appendiceal area.  The  pelvis revealed a markedly enlarged uterus with a large right fibroid.  The uterus was mobile.  There was ability to evaluate an otherwise  adherent left adnexa and a normal right adnexa, normal anterior cul-de-sac.  Posterior cul-de-sac was difficult to  evaluate due to the immensity of the fibroids.  Two accessory ports were made on the right, 2 on the left, 3 robotic for a total of 4 robotic ports and 1 AirSeal port.  The AirSeal was established without difficulty.  Deep Trendelenburg position was  established, and the robotic ports were established with docking of the robot.  ProGrasp, EndoShears and long forceps were placed.  At this time, anterior abdominal wall adhesions were lysed without difficulty.  Left sigmoid adhesions were lysed using  sharp dissection with the unipolar and monopolar cautery.  At this time, the left adnexa were identified.  The left tube was scored along the mesosalpinx and excised and sent to pathology.  Similarly, this was done on the right.  At this time, the  retroperitoneal space was entered on the left.  The ovarian ligament was isolated and divided using bipolar cautery.  The round ligament was divided using bipolar cautery and sharp dissection.  The bladder flap was developed sharply as the uterine  vessels were skeletonized on the left.  The uterine vessels on the left were then cauterized using bipolar cautery, but not cut on the right side.  Similarly, the right ovarian ligament was skeletonized, cauterized, and cut.  The right round ligament was  cauterized, isolated and cut.  The retroperitoneal space was entered.  The uterine vessels were skeletonized on the right, cauterized and cut, the bladder flap having been sharply dissected off the lower uterine segment down to the level of the RUMI cup  on the left side.  The uterine vessels were then divided.  Good hemostasis was noted.  Good blanching.  The specimen was noted.  The specimen then after inflation of the balloon was then  detached.  At this time, the uterus was bivalved, dividing the  uterus into 6 separate pieces for removal vaginally under  direct visualization due to the small caliber of the vagina.  At this time, good hemostasis was noted.  Peristalsis was seen bilaterally on both ureters.  The vaginal cuff was closed in 2 running  layers with a 0 Monocryl V-Loc suture, and a McCall culdoplasty suture was placed.  Irrigation was accomplished.  Arista was placed.  Ropivacaine was placed.  The ovarian pedicles appeared hemostatic.  Urine was clear.  Ureters appeared normal caliber  and peristalsing normally.  At this point, the robot was undocked.  All instruments were removed.  All ports were removed under direct visualization.  Incisions were closed using a 4-0 Vicryl suture.  Dermabond and Marcaine solution was placed.  The  vagina revealed evidence of 3 separate lacerations, 2 along the left sidewall along the right, and 2-0 Vicryl sutures used to close all these in an interrupted fashion.  Good hemostasis was achieved.  The vaginal cuff was inspected, palpated, and found  to be intact.  The urine was clear.  The patient tolerated the procedure well, was awakened and transferred to recovery in good condition.  LN/NUANCE  D:10/08/2018 T:10/08/2018 JOB:[id number]

## 2018-10-09 ENCOUNTER — Encounter (HOSPITAL_BASED_OUTPATIENT_CLINIC_OR_DEPARTMENT_OTHER): Payer: Self-pay | Admitting: Obstetrics and Gynecology

## 2018-10-09 DIAGNOSIS — Z7982 Long term (current) use of aspirin: Secondary | ICD-10-CM | POA: Diagnosis not present

## 2018-10-09 DIAGNOSIS — Z803 Family history of malignant neoplasm of breast: Secondary | ICD-10-CM | POA: Diagnosis not present

## 2018-10-09 DIAGNOSIS — N800 Endometriosis of uterus: Secondary | ICD-10-CM | POA: Diagnosis not present

## 2018-10-09 DIAGNOSIS — Z87891 Personal history of nicotine dependence: Secondary | ICD-10-CM | POA: Diagnosis not present

## 2018-10-09 DIAGNOSIS — E785 Hyperlipidemia, unspecified: Secondary | ICD-10-CM | POA: Diagnosis not present

## 2018-10-09 DIAGNOSIS — Z793 Long term (current) use of hormonal contraceptives: Secondary | ICD-10-CM | POA: Diagnosis not present

## 2018-10-09 DIAGNOSIS — N9971 Accidental puncture and laceration of a genitourinary system organ or structure during a genitourinary system procedure: Secondary | ICD-10-CM | POA: Diagnosis not present

## 2018-10-09 DIAGNOSIS — D252 Subserosal leiomyoma of uterus: Secondary | ICD-10-CM | POA: Diagnosis not present

## 2018-10-09 DIAGNOSIS — I10 Essential (primary) hypertension: Secondary | ICD-10-CM | POA: Diagnosis not present

## 2018-10-09 DIAGNOSIS — Z79899 Other long term (current) drug therapy: Secondary | ICD-10-CM | POA: Diagnosis not present

## 2018-10-09 DIAGNOSIS — F419 Anxiety disorder, unspecified: Secondary | ICD-10-CM | POA: Diagnosis not present

## 2018-10-09 DIAGNOSIS — N736 Female pelvic peritoneal adhesions (postinfective): Secondary | ICD-10-CM | POA: Diagnosis not present

## 2018-10-09 LAB — BASIC METABOLIC PANEL
Anion gap: 10 (ref 5–15)
BUN: 13 mg/dL (ref 6–20)
CO2: 22 mmol/L (ref 22–32)
Calcium: 7.9 mg/dL — ABNORMAL LOW (ref 8.9–10.3)
Chloride: 100 mmol/L (ref 98–111)
Creatinine, Ser: 0.83 mg/dL (ref 0.44–1.00)
GFR calc non Af Amer: >60 mL/min (ref >60)
Glucose, Bld: 152 mg/dL — ABNORMAL HIGH (ref 70–99)
Potassium: 4.3 mmol/L (ref 3.5–5.1)
Sodium: 132 mmol/L — ABNORMAL LOW (ref 135–145)

## 2018-10-09 LAB — CBC
HCT: 36.3 % (ref 36.0–46.0)
Hemoglobin: 11.4 g/dL — ABNORMAL LOW (ref 12.0–15.0)
MCH: 28.9 pg (ref 26.0–34.0)
MCHC: 31.4 g/dL (ref 30.0–36.0)
MCV: 92.1 fL (ref 80.0–100.0)
Platelets: 450 10*3/uL — ABNORMAL HIGH (ref 150–400)
RBC: 3.94 MIL/uL (ref 3.87–5.11)
RDW: 14.3 % (ref 11.5–15.5)
WBC: 14.5 10*3/uL — ABNORMAL HIGH (ref 4.0–10.5)
nRBC: 0 % (ref 0.0–0.2)

## 2018-10-09 MED ORDER — OXYCODONE-ACETAMINOPHEN 5-325 MG PO TABS: 1.0000 | ORAL_TABLET | ORAL | 0 refills | Status: DC | PRN

## 2018-10-09 MED ORDER — TRAMADOL HCL 50 MG PO TABS: 50.0000 mg | ORAL_TABLET | Freq: Four times a day (QID) | ORAL | 0 refills | Status: DC | PRN

## 2018-10-09 NOTE — Transfer of Care (Signed)
Immediate Anesthesia Transfer of Care Note   Last Vitals:  Vitals Value Taken Time  BP    Temp    Pulse    Resp    SpO2      Last Pain:  Vitals:   10/09/18 0655  TempSrc:   PainSc: 1       Patients Stated Pain Goal: 2 (10/09/18 0981) Immediate Anesthesia Transfer of Care Note  Patient: Jenny Harrington  Procedure(s) Performed: Procedure(s) (LRB): XI ROBOTIC ASSISTED LAPAROSCOPIC HYSTERECTOMY AND SALPINGECTOMY (Bilateral)  Patient Location: PACU  Anesthesia Type: General  Level of Consciousness: awake, alert  and oriented  Airway & Oxygen Therapy: Patient Spontanous Breathing and Patient connected to face mask oxygen  Post-op Assessment: Report given to PACU RN and Post -op Vital signs reviewed and stable  Post vital signs: Reviewed and stable  Complications: No apparent anesthesia complications

## 2018-10-09 NOTE — H&P (Signed)
NAME: Jenny Harrington, Jenny Harrington MEDICAL RECORD [id number] ACCOUNT [id number] DATE OF BIRTH:1965-12-15 FACILITY: [facility] LOCATION: [facility] PHYSICIAN:Trinidi Toppins J. Ronita Hipps, MD  HISTORY AND PHYSICAL  DATE OF ADMISSION:  10/08/2018  CHIEF COMPLAINT:  Failed ablation with abnormal uterine bleeding.  HISTORY OF PRESENT ILLNESS:  A 53 year old white female, G0, with failed NovaSure ablation who presents with persistent bleeding, symptomatic fibroids for definitive therapy.  MEDICATIONS:  Include simvastatin, losartan, citalopram and Aleve as needed.  ALLERGIES:  She has no known drug allergies.  FAMILY HISTORY:  Neurologic disease, hypertension, and breast cancer.  SOCIAL HISTORY:  She is a nonsmoker, nondrinker.  She denies domestic physical violence.  REVIEW OF:  Remarkable for hysteroscopy, NovaSure ablation, tonsillectomy.  PHYSICAL EXAMINATION: GENERAL:  Well-developed, well-nourished white female in no acute distress. HEENT:  Normal. NECK:  Supple, full range of motion. LUNGS:  Clear. HEART:  Regular rhythm. ABDOMEN:  Soft, nontender. PELVIC:  Reveals 8-10 week size uterus and no adnexal masses. EXTREMITIES:  No cords. NEUROLOGIC:  Nonfocal. SKIN:  Intact.  IMPRESSION:   1.  Symptomatic fibroids. 2.  Failed endometrial ablation.  PLAN:  Proceed with da Vinci assisted total laparoscopic hysterectomy, bilateral salpingectomy.  Risks of anesthesia, infection, bleeding, and surrounding organs, possible need for repair discussed, delayed risks and complications to include bowel and  bladder injury noted.  The patient acknowledges and wishes to proceed.  VN/NUANCE  D:10/07/2018 T:10/08/2018 JOB:[id number]

## 2018-10-09 NOTE — Progress Notes (Signed)
BP repeated multiply time and dr. Ronita Hipps notified by phone no new orders will repeat 1 more time before D/c home.

## 2018-10-09 NOTE — Progress Notes (Signed)
1 Day Post-Op Procedure(s) (LRB): XI ROBOTIC ASSISTED LAPAROSCOPIC HYSTERECTOMY AND SALPINGECTOMY (Bilateral)  Subjective: Patient reports nausea, incisional pain, tolerating PO, + flatus and no problems voiding.    Objective: BP 119/69 (BP Location: Right Arm)   Pulse 79   Temp 99.1 F (37.3 C)   Resp 18   Ht 5\' 1"  (1.549 m)   Wt 74.8 kg   LMP 08/26/2018   SpO2 98%   BMI 31.18 kg/m   CBC    Component Value Date/Time   WBC 14.5 (H) 10/09/2018 0420   RBC 3.94 10/09/2018 0420   HGB 11.4 (L) 10/09/2018 0420   HCT 36.3 10/09/2018 0420   PLT 450 (H) 10/09/2018 0420   MCV 92.1 10/09/2018 0420   MCH 28.9 10/09/2018 0420   MCHC 31.4 10/09/2018 0420   RDW 14.3 10/09/2018 0420   BMP Latest Ref Rng & Units 10/09/2018 10/01/2018 07/04/2016  Glucose 70 - 99 mg/dL 152(H) 84 93  BUN 6 - 20 mg/dL 13 17 18   Creatinine 0.44 - 1.00 mg/dL 0.83 0.81 0.78  Sodium 135 - 145 mmol/L 132(L) 140 135  Potassium 3.5 - 5.1 mmol/L 4.3 4.5 4.4  Chloride 98 - 111 mmol/L 100 107 99(L)  CO2 22 - 32 mmol/L 22 26 28   Calcium 8.9 - 10.3 mg/dL 7.9(L) 9.2 9.1     I have reviewed patient's vital signs, medications and labs.  General: alert, cooperative and appears stated age Resp: clear to auscultation bilaterally and normal percussion bilaterally Cardio: regular rate and rhythm, S1, S2 normal, no murmur, click, rub or gallop and regular rate and rhythm GI: soft, non-tender; bowel sounds normal; no masses,  no organomegaly and incision: clean, dry and intact Extremities: extremities normal, atraumatic, no cyanosis or edema and Homans sign is negative, no sign of DVT Vaginal Bleeding: minimal  Assessment: s/p Procedure(s): XI ROBOTIC ASSISTED LAPAROSCOPIC HYSTERECTOMY AND SALPINGECTOMY (Bilateral): stable, progressing well and tolerating diet  Plan: Advance diet Encourage ambulation Advance to PO medication Discontinue IV fluids Discharge home  LOS: 0 days    Chrisoula Zegarra J 10/09/2018, 6:31  AM

## 2018-10-10 NOTE — Anesthesia Postprocedure Evaluation (Signed)
Anesthesia Post Note  Patient: HARPER VANDERVOORT  Procedure(s) Performed: XI ROBOTIC ASSISTED LAPAROSCOPIC HYSTERECTOMY AND SALPINGECTOMY (Bilateral )     Patient location during evaluation: PACU Anesthesia Type: General Level of consciousness: awake and alert Pain management: pain level controlled Vital Signs Assessment: post-procedure vital signs reviewed and stable Respiratory status: spontaneous breathing, nonlabored ventilation, respiratory function stable and patient connected to nasal cannula oxygen Cardiovascular status: blood pressure returned to baseline and stable Postop Assessment: no apparent nausea or vomiting Anesthetic complications: no    Last Vitals:  Vitals:   10/09/18 0813 10/09/18 0817  BP: 100/61 110/63  Pulse:  75  Resp:    Temp:    SpO2:      Last Pain:  Vitals:   10/09/18 0655  TempSrc:   PainSc: [place] D Angeliah Wisdom

## 2018-10-21 DIAGNOSIS — H6993 Unspecified Eustachian tube disorder, bilateral: Secondary | ICD-10-CM | POA: Diagnosis not present

## 2018-10-21 DIAGNOSIS — Z6829 Body mass index (BMI) 29.0-29.9, adult: Secondary | ICD-10-CM | POA: Diagnosis not present

## 2018-10-21 DIAGNOSIS — E663 Overweight: Secondary | ICD-10-CM | POA: Diagnosis not present

## 2018-10-21 DIAGNOSIS — Z1389 Encounter for screening for other disorder: Secondary | ICD-10-CM | POA: Diagnosis not present

## 2018-10-21 DIAGNOSIS — J019 Acute sinusitis, unspecified: Secondary | ICD-10-CM | POA: Diagnosis not present

## 2018-11-19 DIAGNOSIS — Z09 Encounter for follow-up examination after completed treatment for conditions other than malignant neoplasm: Secondary | ICD-10-CM | POA: Diagnosis not present

## 2019-03-10 ENCOUNTER — Ambulatory Visit
Admission: RE | Admit: 2019-03-10 | Discharge: 2019-03-10 | Disposition: A | Discharge status: Home / Self Care | Payer: BC Managed Care – PPO | Source: Ambulatory Visit | Attending: Family Medicine | Admitting: Family Medicine

## 2019-03-10 ENCOUNTER — Other Ambulatory Visit: Payer: Self-pay | Admitting: Family Medicine

## 2019-03-10 ENCOUNTER — Other Ambulatory Visit: Payer: Self-pay

## 2019-03-10 DIAGNOSIS — Z1231 Encounter for screening mammogram for malignant neoplasm of breast: Secondary | ICD-10-CM

## 2019-03-31 DIAGNOSIS — Z6831 Body mass index (BMI) 31.0-31.9, adult: Secondary | ICD-10-CM | POA: Diagnosis not present

## 2019-03-31 DIAGNOSIS — J0100 Acute maxillary sinusitis, unspecified: Secondary | ICD-10-CM | POA: Diagnosis not present

## 2019-04-03 DIAGNOSIS — H10503 Unspecified blepharoconjunctivitis, bilateral: Secondary | ICD-10-CM | POA: Diagnosis not present

## 2019-04-17 DIAGNOSIS — H5213 Myopia, bilateral: Secondary | ICD-10-CM | POA: Diagnosis not present

## 2019-04-17 DIAGNOSIS — H524 Presbyopia: Secondary | ICD-10-CM | POA: Diagnosis not present

## 2019-07-03 DIAGNOSIS — I10 Essential (primary) hypertension: Secondary | ICD-10-CM | POA: Diagnosis not present

## 2019-07-03 DIAGNOSIS — E6609 Other obesity due to excess calories: Secondary | ICD-10-CM | POA: Diagnosis not present

## 2019-07-03 DIAGNOSIS — E785 Hyperlipidemia, unspecified: Secondary | ICD-10-CM | POA: Diagnosis not present

## 2019-07-03 DIAGNOSIS — F329 Major depressive disorder, single episode, unspecified: Secondary | ICD-10-CM | POA: Diagnosis not present

## 2019-07-03 DIAGNOSIS — Z6831 Body mass index (BMI) 31.0-31.9, adult: Secondary | ICD-10-CM | POA: Diagnosis not present

## 2019-09-25 DIAGNOSIS — Z20828 Contact with and (suspected) exposure to other viral communicable diseases: Secondary | ICD-10-CM | POA: Diagnosis not present

## 2019-09-29 ENCOUNTER — Other Ambulatory Visit: Payer: Self-pay

## 2019-12-25 DIAGNOSIS — I10 Essential (primary) hypertension: Secondary | ICD-10-CM | POA: Diagnosis not present

## 2019-12-25 DIAGNOSIS — Z6831 Body mass index (BMI) 31.0-31.9, adult: Secondary | ICD-10-CM | POA: Diagnosis not present

## 2019-12-25 DIAGNOSIS — E6609 Other obesity due to excess calories: Secondary | ICD-10-CM | POA: Diagnosis not present

## 2019-12-25 DIAGNOSIS — Z1389 Encounter for screening for other disorder: Secondary | ICD-10-CM | POA: Diagnosis not present

## 2020-01-16 ENCOUNTER — Ambulatory Visit: Payer: Self-pay | Attending: Internal Medicine

## 2020-01-16 DIAGNOSIS — Z23 Encounter for immunization: Secondary | ICD-10-CM

## 2020-01-16 NOTE — Progress Notes (Signed)
   Covid-19 Vaccination Clinic  Name:  Jenny Harrington    MRN: [id number] DOB: 05-28-1966  01/16/2020  Ms. Lanoue was observed post Covid-19 immunization for 15 minutes without incident. She was provided with Vaccine Information Sheet and instruction to access the V-Safe system.   Ms. Haddix was instructed to call 911 with any severe reactions post vaccine: Marland Kitchen Difficulty breathing  . Swelling of face and throat  . A fast heartbeat  . A bad rash all over body  . Dizziness and weakness   Immunizations Administered    Name Date Dose VIS Date Route   Pfizer COVID-19 Vaccine 01/16/2020  9:59 AM 0.3 mL 11/19/2018 Intramuscular   Manufacturer: [place]   Lot: [id number]   [place]: [id number]

## 2020-02-09 ENCOUNTER — Ambulatory Visit: Payer: Self-pay | Attending: Internal Medicine

## 2020-02-09 DIAGNOSIS — Z23 Encounter for immunization: Secondary | ICD-10-CM

## 2020-02-09 NOTE — Progress Notes (Signed)
   Covid-19 Vaccination Clinic  Name:  Jenny Harrington    MRN: [id number] DOB: 10/04/1965  02/09/2020  [name] was observed post Covid-19 immunization for 15 minutes without incident. She was provided with Vaccine Information Sheet and instruction to access the V-Safe system.   [name] was instructed to call 911 with any severe reactions post vaccine: Marland Kitchen Difficulty breathing  . Swelling of face and throat  . A fast heartbeat  . A bad rash all over body  . Dizziness and weakness   Immunizations Administered    Name Date Dose VIS Date Route   Pfizer COVID-19 Vaccine 02/09/2020  8:43 AM 0.3 mL 11/19/2018 Intramuscular   Manufacturer: [place]   Lot: [id number]   [place]: [id number]

## 2020-02-26 ENCOUNTER — Other Ambulatory Visit: Payer: Self-pay | Admitting: Family Medicine

## 2020-02-26 DIAGNOSIS — Z1231 Encounter for screening mammogram for malignant neoplasm of breast: Secondary | ICD-10-CM

## 2020-04-06 ENCOUNTER — Ambulatory Visit: Payer: Self-pay

## 2020-04-08 ENCOUNTER — Ambulatory Visit
Admission: RE | Admit: 2020-04-08 | Discharge: 2020-04-08 | Disposition: A | Discharge status: Home / Self Care | Payer: BC Managed Care – PPO | Source: Ambulatory Visit | Attending: Family Medicine | Admitting: Family Medicine

## 2020-04-08 ENCOUNTER — Other Ambulatory Visit: Payer: Self-pay

## 2020-04-08 DIAGNOSIS — Z1231 Encounter for screening mammogram for malignant neoplasm of breast: Secondary | ICD-10-CM | POA: Diagnosis not present

## 2020-04-20 DIAGNOSIS — H524 Presbyopia: Secondary | ICD-10-CM | POA: Diagnosis not present

## 2020-04-20 DIAGNOSIS — H5213 Myopia, bilateral: Secondary | ICD-10-CM | POA: Diagnosis not present

## 2020-05-17 DIAGNOSIS — E6609 Other obesity due to excess calories: Secondary | ICD-10-CM | POA: Diagnosis not present

## 2020-05-17 DIAGNOSIS — Z6831 Body mass index (BMI) 31.0-31.9, adult: Secondary | ICD-10-CM | POA: Diagnosis not present

## 2020-05-17 DIAGNOSIS — J301 Allergic rhinitis due to pollen: Secondary | ICD-10-CM | POA: Diagnosis not present

## 2020-05-17 DIAGNOSIS — J22 Unspecified acute lower respiratory infection: Secondary | ICD-10-CM | POA: Diagnosis not present

## 2020-07-21 DIAGNOSIS — A499 Bacterial infection, unspecified: Secondary | ICD-10-CM | POA: Diagnosis not present

## 2020-07-21 DIAGNOSIS — J301 Allergic rhinitis due to pollen: Secondary | ICD-10-CM | POA: Diagnosis not present

## 2020-07-21 DIAGNOSIS — Z681 Body mass index (BMI) 19 or less, adult: Secondary | ICD-10-CM | POA: Diagnosis not present

## 2020-09-11 ENCOUNTER — Ambulatory Visit: Payer: BC Managed Care – PPO | Attending: Internal Medicine

## 2020-09-11 DIAGNOSIS — Z23 Encounter for immunization: Secondary | ICD-10-CM

## 2020-09-11 NOTE — Progress Notes (Signed)
   Covid-19 Vaccination Clinic  Name:  DABNEY DEVER    MRN: [id number] DOB: [birth year]  09/11/2020  Ms. Decarlo was observed post Covid-19 immunization for 15 minutes without incident. She was provided with Vaccine Information Sheet and instruction to access the V-Safe system.   Ms. Zuleta was instructed to call 911 with any severe reactions post vaccine: Marland Kitchen Difficulty breathing  . Swelling of face and throat  . A fast heartbeat  . A bad rash all over body  . Dizziness and weakness   Immunizations Administered    Name Date Dose VIS Date Route   Pfizer COVID-19 Vaccine 09/11/2020  9:25 AM 0.3 mL 07/14/2020 Intramuscular   Manufacturer: [place]   Lot: [id number]   [place]: [id number]

## 2020-09-29 ENCOUNTER — Ambulatory Visit (HOSPITAL_COMMUNITY)
Admission: RE | Admit: 2020-09-29 | Discharge: 2020-09-29 | Disposition: A | Discharge status: Home / Self Care | Payer: BC Managed Care – PPO | Source: Ambulatory Visit | Attending: Family Medicine | Admitting: Family Medicine

## 2020-09-29 ENCOUNTER — Other Ambulatory Visit (HOSPITAL_COMMUNITY): Payer: Self-pay | Admitting: Family Medicine

## 2020-09-29 ENCOUNTER — Other Ambulatory Visit: Payer: Self-pay

## 2020-09-29 DIAGNOSIS — R051 Acute cough: Secondary | ICD-10-CM

## 2020-09-29 DIAGNOSIS — R059 Cough, unspecified: Secondary | ICD-10-CM | POA: Diagnosis not present

## 2020-09-29 DIAGNOSIS — Z681 Body mass index (BMI) 19 or less, adult: Secondary | ICD-10-CM | POA: Diagnosis not present

## 2020-09-29 DIAGNOSIS — R498 Other voice and resonance disorders: Secondary | ICD-10-CM | POA: Diagnosis not present

## 2020-10-06 DIAGNOSIS — G4730 Sleep apnea, unspecified: Secondary | ICD-10-CM | POA: Diagnosis not present

## 2020-10-25 DIAGNOSIS — J310 Chronic rhinitis: Secondary | ICD-10-CM | POA: Diagnosis not present

## 2020-10-25 DIAGNOSIS — R490 Dysphonia: Secondary | ICD-10-CM | POA: Diagnosis not present

## 2020-10-25 DIAGNOSIS — J343 Hypertrophy of nasal turbinates: Secondary | ICD-10-CM | POA: Diagnosis not present

## 2020-10-25 DIAGNOSIS — J342 Deviated nasal septum: Secondary | ICD-10-CM | POA: Diagnosis not present

## 2020-10-25 DIAGNOSIS — K219 Gastro-esophageal reflux disease without esophagitis: Secondary | ICD-10-CM | POA: Diagnosis not present

## 2020-11-03 DIAGNOSIS — J329 Chronic sinusitis, unspecified: Secondary | ICD-10-CM | POA: Diagnosis not present

## 2021-01-10 IMAGING — MG DIGITAL SCREENING BILATERAL MAMMOGRAM WITH TOMO AND CAD
8 series · 9 of 24 positions shown · non-contrast
Comparison: Previous exam(s).

CLINICAL DATA: Screening.

EXAM:
DIGITAL SCREENING BILATERAL MAMMOGRAM WITH TOMO AND CAD

[R CC synth-2D]
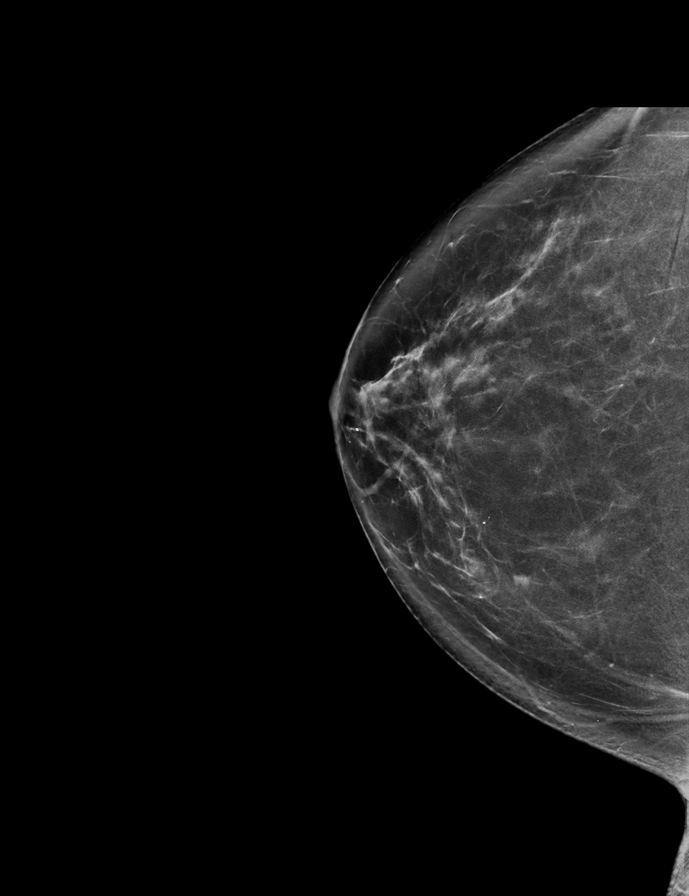

[L MLO synth-2D]
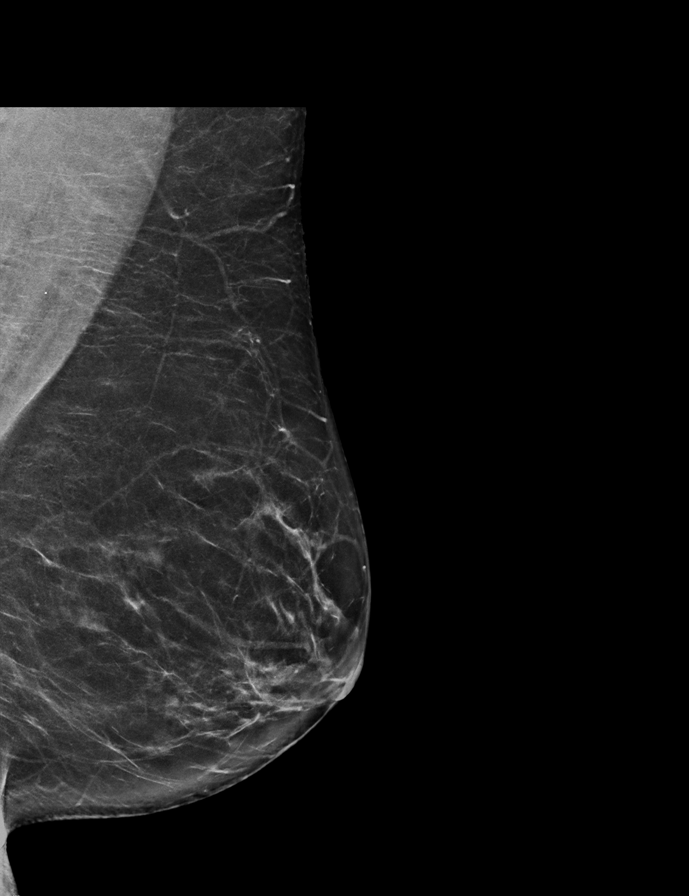

[R MLO synth-2D]
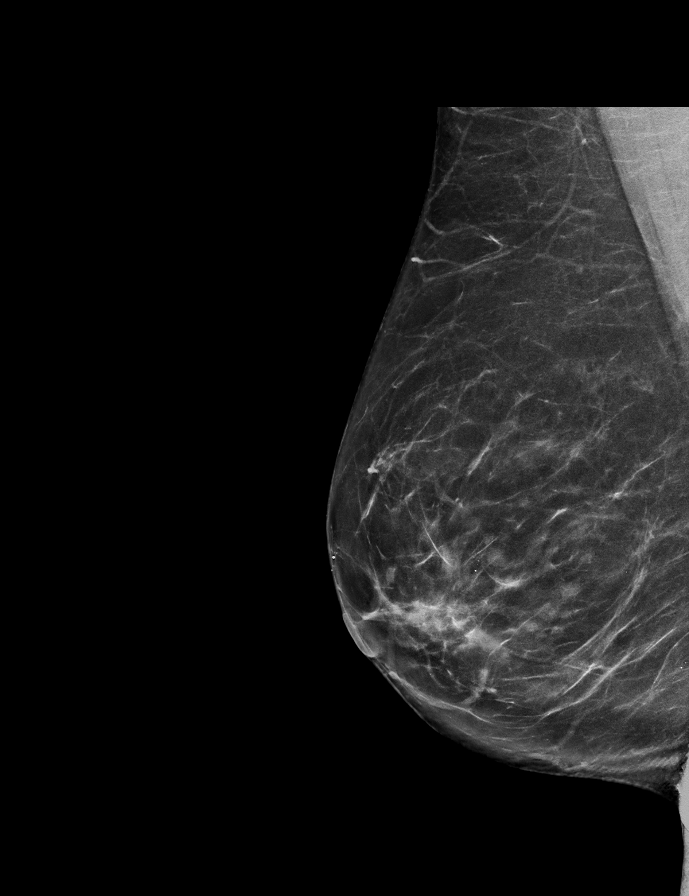

[L CC synth-2D]
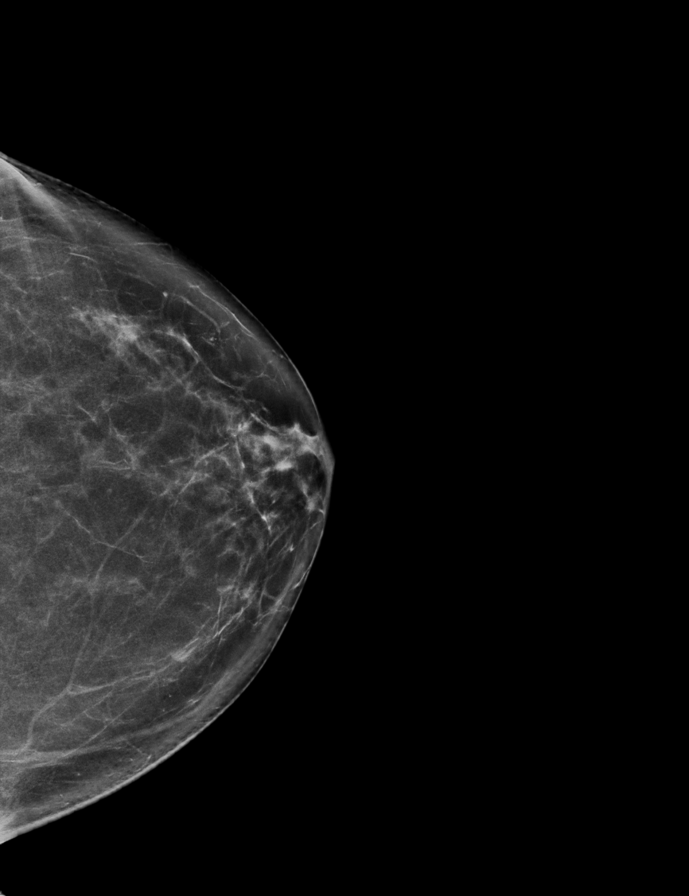

[R MLO tomo · 2 of 72 frames shown]
[frame 24/72]
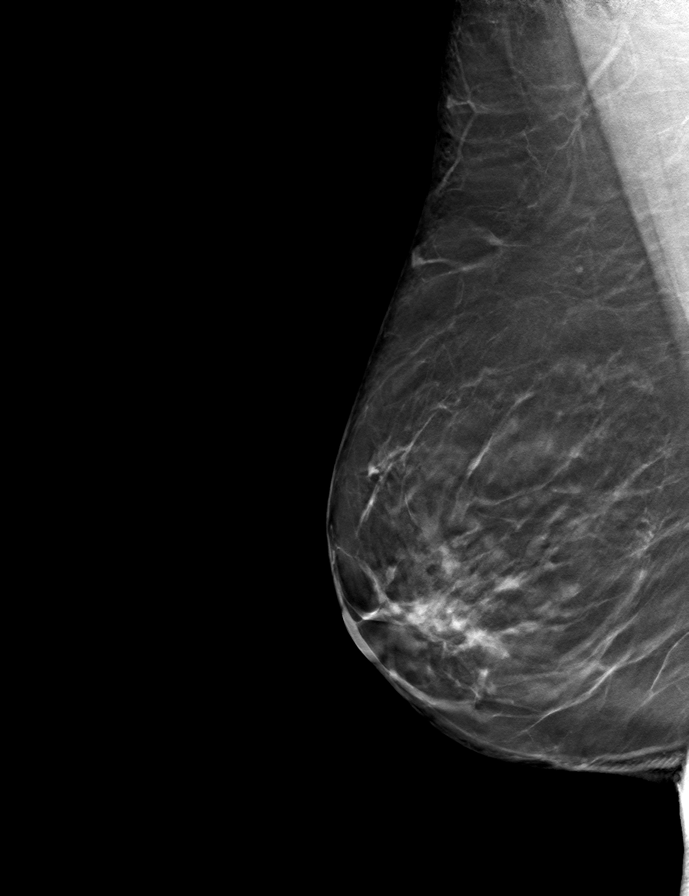
[frame 37/72]
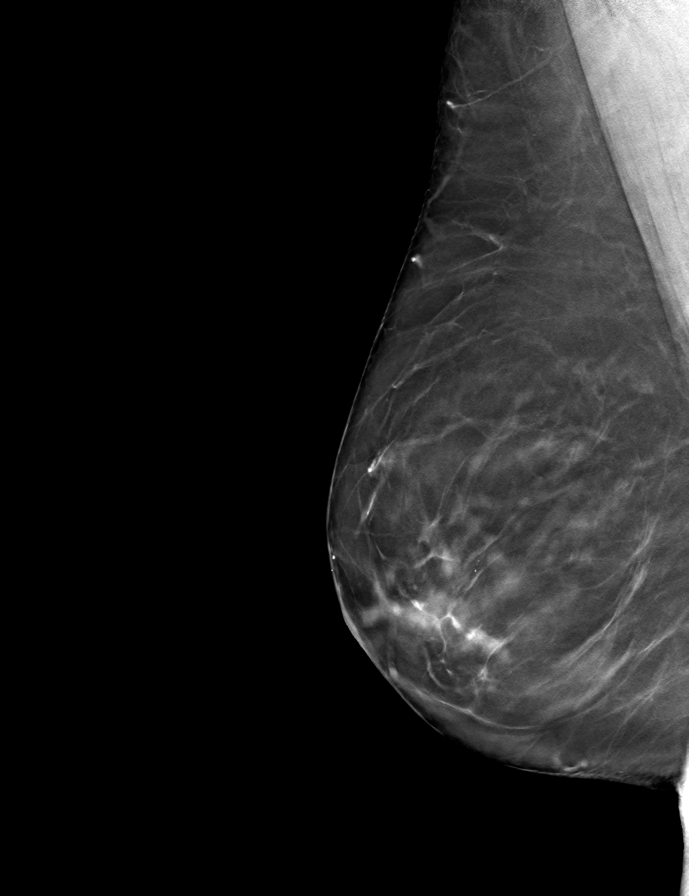

[L CC tomo · tomo slice 34/67.0]
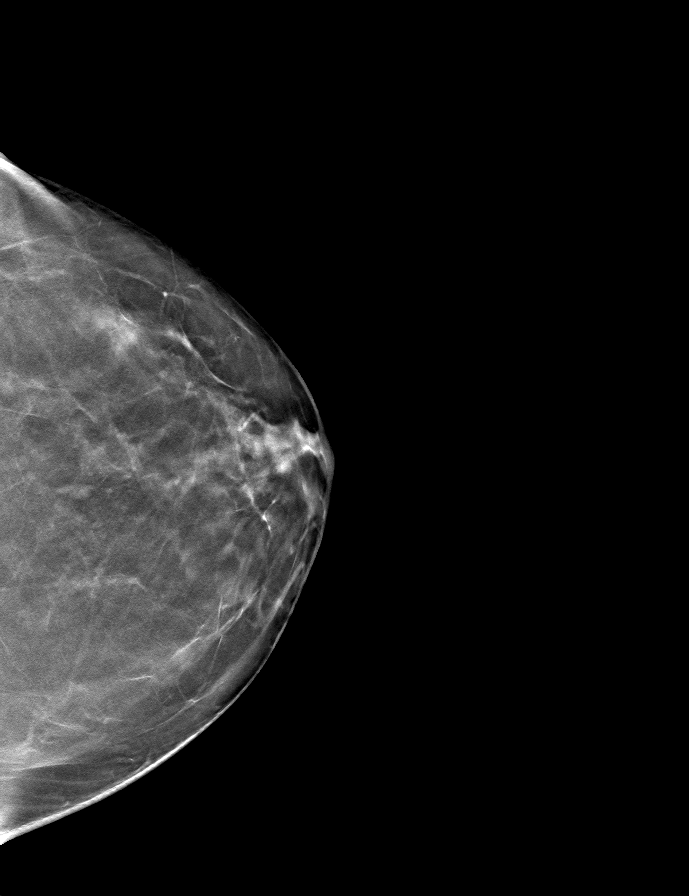

[L MLO tomo · tomo slice 36/71.0]
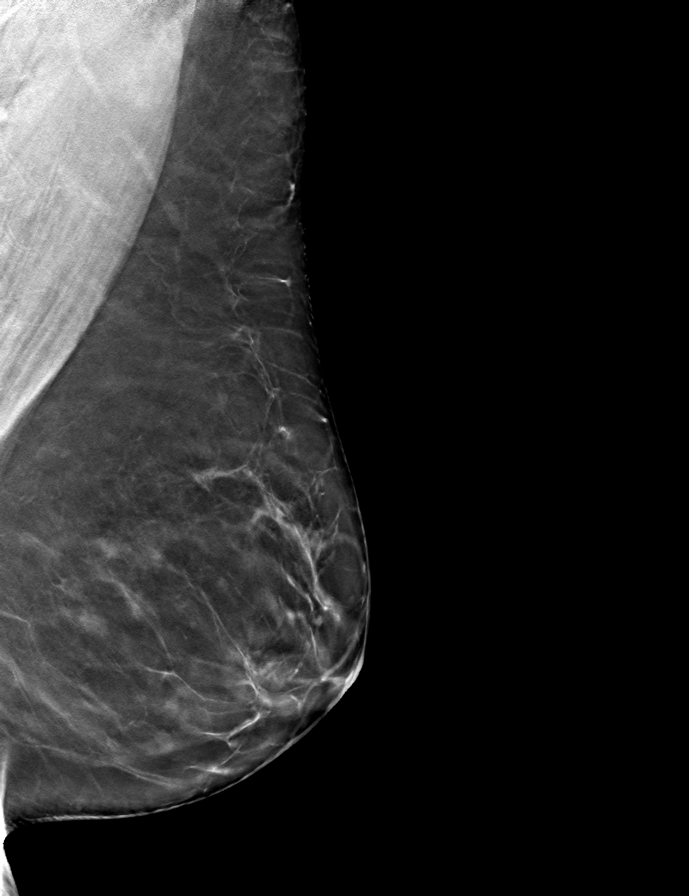

[R CC tomo · tomo slice 37/73.0]
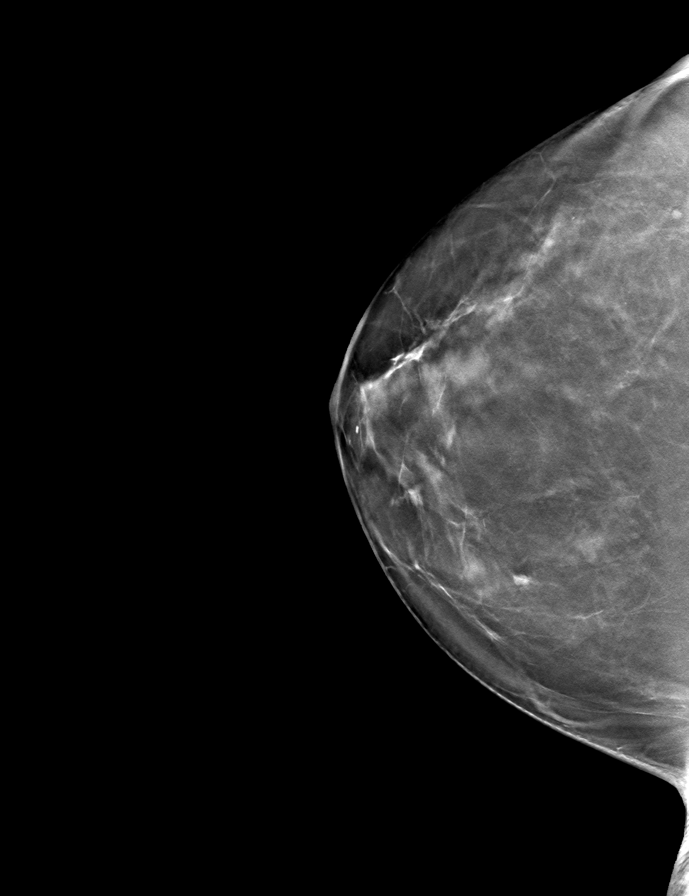

[9 of 24 positions shown; findings below may reference images not displayed]

ACR Breast Density Category b: There are scattered areas of
fibroglandular density.
FINDINGS: There are no findings suspicious for malignancy. Images were
processed with CAD.
IMPRESSION: No mammographic evidence of malignancy. A result letter of this
screening mammogram will be mailed directly to the patient.

RECOMMENDATION:
Screening mammogram in one year. (Code:[id number])

BI-RADS CATEGORY  1: Negative.

## 2021-01-11 ENCOUNTER — Other Ambulatory Visit: Payer: Self-pay | Admitting: Otolaryngology

## 2021-01-11 DIAGNOSIS — J343 Hypertrophy of nasal turbinates: Secondary | ICD-10-CM | POA: Diagnosis not present

## 2021-01-11 DIAGNOSIS — R0982 Postnasal drip: Secondary | ICD-10-CM | POA: Diagnosis not present

## 2021-01-11 DIAGNOSIS — J342 Deviated nasal septum: Secondary | ICD-10-CM | POA: Diagnosis not present

## 2021-01-17 ENCOUNTER — Other Ambulatory Visit: Payer: Self-pay

## 2021-01-17 ENCOUNTER — Encounter (HOSPITAL_BASED_OUTPATIENT_CLINIC_OR_DEPARTMENT_OTHER): Payer: Self-pay | Admitting: Otolaryngology

## 2021-01-21 ENCOUNTER — Encounter (HOSPITAL_BASED_OUTPATIENT_CLINIC_OR_DEPARTMENT_OTHER)
Admission: RE | Admit: 2021-01-21 | Discharge: 2021-01-21 | Disposition: A | Discharge status: Home / Self Care | Payer: BC Managed Care – PPO | Source: Ambulatory Visit | Attending: Otolaryngology | Admitting: Otolaryngology

## 2021-01-21 ENCOUNTER — Other Ambulatory Visit (HOSPITAL_COMMUNITY)
Admission: RE | Admit: 2021-01-21 | Discharge: 2021-01-21 | Disposition: A | Discharge status: Home / Self Care | Payer: BC Managed Care – PPO | Source: Ambulatory Visit | Attending: Otolaryngology | Admitting: Otolaryngology

## 2021-01-21 DIAGNOSIS — Z01812 Encounter for preprocedural laboratory examination: Secondary | ICD-10-CM | POA: Diagnosis not present

## 2021-01-21 DIAGNOSIS — Z01810 Encounter for preprocedural cardiovascular examination: Secondary | ICD-10-CM | POA: Diagnosis not present

## 2021-01-21 DIAGNOSIS — Z20822 Contact with and (suspected) exposure to covid-19: Secondary | ICD-10-CM | POA: Diagnosis not present

## 2021-01-21 LAB — SARS CORONAVIRUS 2 (TAT 6-24 HRS): SARS Coronavirus 2: NEGATIVE

## 2021-01-24 NOTE — Anesthesia Preprocedure Evaluation (Addendum)
Anesthesia Evaluation  Patient identified by MRN, date of birth, ID band Patient awake    Reviewed: Allergy & Precautions, NPO status , Patient's Chart, lab work & pertinent test results  Airway Mallampati: III  TM Distance: >3 FB Neck ROM: Full    Dental no notable dental hx. (+) Teeth Intact, Dental Advisory Given   Pulmonary sleep apnea (no CPAP was prescribed one but never picked it up) , former smoker,  Quit smoking 2007, light smoker   Pulmonary exam normal breath sounds clear to auscultation       Cardiovascular hypertension, Pt. on medications Normal cardiovascular exam Rhythm:Regular Rate:Normal     Neuro/Psych PSYCHIATRIC DISORDERS Depression negative neurological ROS     GI/Hepatic Neg liver ROS, GERD  Medicated and Controlled,  Endo/Other  negative endocrine ROS  Renal/GU negative Renal ROS  negative genitourinary   Musculoskeletal negative musculoskeletal ROS (+)   Abdominal   Peds  Hematology negative hematology ROS (+)   Anesthesia Other Findings Deviated septum, bilateral turbinate hypertrophy  Reproductive/Obstetrics negative OB ROS                            Anesthesia Physical Anesthesia Plan  ASA: III  Anesthesia Plan: General   Post-op Pain Management:    Induction: Intravenous  PONV Risk Score and Plan: 4 or greater and Ondansetron, Dexamethasone, Midazolam, Scopolamine patch - Pre-op and Treatment may vary due to age or medical condition  Airway Management Planned: Oral ETT  Additional Equipment: None  Intra-op Plan:   Post-operative Plan: Extubation in OR  Informed Consent: I have reviewed the patients History and Physical, chart, labs and discussed the procedure including the risks, benefits and alternatives for the proposed anesthesia with the patient or authorized representative who has indicated his/her understanding and acceptance.     Dental  advisory given  Plan Discussed with: CRNA  Anesthesia Plan Comments:        Anesthesia Quick Evaluation

## 2021-01-25 ENCOUNTER — Other Ambulatory Visit: Payer: Self-pay

## 2021-01-25 ENCOUNTER — Ambulatory Visit (HOSPITAL_BASED_OUTPATIENT_CLINIC_OR_DEPARTMENT_OTHER): Payer: BC Managed Care – PPO | Admitting: Anesthesiology

## 2021-01-25 ENCOUNTER — Encounter (HOSPITAL_BASED_OUTPATIENT_CLINIC_OR_DEPARTMENT_OTHER)
Admission: RE | Disposition: A | Discharge status: Home / Self Care | Payer: Self-pay | Source: Home / Self Care | Attending: Otolaryngology

## 2021-01-25 ENCOUNTER — Encounter (HOSPITAL_BASED_OUTPATIENT_CLINIC_OR_DEPARTMENT_OTHER): Payer: Self-pay | Admitting: Otolaryngology

## 2021-01-25 ENCOUNTER — Ambulatory Visit (HOSPITAL_BASED_OUTPATIENT_CLINIC_OR_DEPARTMENT_OTHER)
Admission: RE | Admit: 2021-01-25 | Discharge: 2021-01-25 | Disposition: A | Discharge status: Home / Self Care | Payer: BC Managed Care – PPO | Attending: Otolaryngology | Admitting: Otolaryngology

## 2021-01-25 DIAGNOSIS — J342 Deviated nasal septum: Secondary | ICD-10-CM | POA: Insufficient documentation

## 2021-01-25 DIAGNOSIS — D259 Leiomyoma of uterus, unspecified: Secondary | ICD-10-CM | POA: Diagnosis not present

## 2021-01-25 DIAGNOSIS — J343 Hypertrophy of nasal turbinates: Secondary | ICD-10-CM | POA: Insufficient documentation

## 2021-01-25 DIAGNOSIS — J3489 Other specified disorders of nose and nasal sinuses: Secondary | ICD-10-CM | POA: Diagnosis not present

## 2021-01-25 HISTORY — DX: Sleep apnea, unspecified: G47.30

## 2021-01-25 HISTORY — PX: NASAL SEPTOPLASTY W/ TURBINOPLASTY: SHX2070

## 2021-01-25 SURGERY — SEPTOPLASTY, NOSE, WITH NASAL TURBINATE REDUCTION
Anesthesia: General | Site: Nose

## 2021-01-25 MED ORDER — FENTANYL CITRATE (PF) 100 MCG/2ML IJ SOLN
INTRAMUSCULAR | Status: AC
  Filled 2021-01-25: qty 2

## 2021-01-25 MED ORDER — OXYCODONE-ACETAMINOPHEN 5-325 MG PO TABS: 1.0000 | ORAL_TABLET | ORAL | 0 refills | Status: AC | PRN

## 2021-01-25 MED ORDER — LIDOCAINE HCL (CARDIAC) PF 100 MG/5ML IV SOSY
PREFILLED_SYRINGE | INTRAVENOUS | Status: DC | PRN
  Administered 2021-01-25: 60 mg via INTRAVENOUS

## 2021-01-25 MED ORDER — HYDROMORPHONE HCL 1 MG/ML IJ SOLN: 0.2500 mg | INTRAMUSCULAR | Status: DC | PRN

## 2021-01-25 MED ORDER — OXYCODONE HCL 5 MG/5ML PO SOLN: 5.0000 mg | Freq: Once | ORAL | Status: DC | PRN

## 2021-01-25 MED ORDER — MUPIROCIN 2 % EX OINT
TOPICAL_OINTMENT | CUTANEOUS | Status: DC | PRN
  Administered 2021-01-25: 1 via TOPICAL

## 2021-01-25 MED ORDER — LACTATED RINGERS IV SOLN: INTRAVENOUS | Status: DC

## 2021-01-25 MED ORDER — PHENYLEPHRINE HCL (PRESSORS) 10 MG/ML IV SOLN
INTRAVENOUS | Status: DC | PRN
  Administered 2021-01-25: 40 ug via INTRAVENOUS

## 2021-01-25 MED ORDER — PROMETHAZINE HCL 25 MG/ML IJ SOLN: 6.2500 mg | INTRAMUSCULAR | Status: DC | PRN

## 2021-01-25 MED ORDER — OXYMETAZOLINE HCL 0.05 % NA SOLN
NASAL | Status: DC | PRN
  Administered 2021-01-25: 1

## 2021-01-25 MED ORDER — LIDOCAINE 2% (20 MG/ML) 5 ML SYRINGE
INTRAMUSCULAR | Status: AC
  Filled 2021-01-25: qty 5

## 2021-01-25 MED ORDER — AMOXICILLIN 875 MG PO TABS: 875.0000 mg | ORAL_TABLET | Freq: Two times a day (BID) | ORAL | 0 refills | Status: AC

## 2021-01-25 MED ORDER — DEXAMETHASONE SODIUM PHOSPHATE 4 MG/ML IJ SOLN
INTRAMUSCULAR | Status: DC | PRN
  Administered 2021-01-25: 10 mg via INTRAVENOUS

## 2021-01-25 MED ORDER — SCOPOLAMINE 1 MG/3DAYS TD PT72
1.0000 | MEDICATED_PATCH | TRANSDERMAL | Status: DC
  Administered 2021-01-25: 1.5 mg via TRANSDERMAL

## 2021-01-25 MED ORDER — PROPOFOL 10 MG/ML IV BOLUS
INTRAVENOUS | Status: DC | PRN
  Administered 2021-01-25: 150 mg via INTRAVENOUS

## 2021-01-25 MED ORDER — ROCURONIUM BROMIDE 100 MG/10ML IV SOLN
INTRAVENOUS | Status: DC | PRN
  Administered 2021-01-25: 80 mg via INTRAVENOUS

## 2021-01-25 MED ORDER — OXYCODONE HCL 5 MG PO TABS
5.0000 mg | ORAL_TABLET | Freq: Once | ORAL | Status: DC | PRN
Start: 2021-01-25 — End: 2021-01-25

## 2021-01-25 MED ORDER — MIDAZOLAM HCL 5 MG/5ML IJ SOLN
INTRAMUSCULAR | Status: DC | PRN
  Administered 2021-01-25: 2 mg via INTRAVENOUS

## 2021-01-25 MED ORDER — SUGAMMADEX SODIUM 500 MG/5ML IV SOLN
INTRAVENOUS | Status: DC | PRN
  Administered 2021-01-25: 300 mg via INTRAVENOUS

## 2021-01-25 MED ORDER — PHENYLEPHRINE 40 MCG/ML (10ML) SYRINGE FOR IV PUSH (FOR BLOOD PRESSURE SUPPORT)
PREFILLED_SYRINGE | INTRAVENOUS | Status: AC
  Filled 2021-01-25: qty 10

## 2021-01-25 MED ORDER — MIDAZOLAM HCL 2 MG/2ML IJ SOLN
INTRAMUSCULAR | Status: AC
  Filled 2021-01-25: qty 2

## 2021-01-25 MED ORDER — FENTANYL CITRATE (PF) 100 MCG/2ML IJ SOLN
INTRAMUSCULAR | Status: DC | PRN
  Administered 2021-01-25 (×2): 50 ug via INTRAVENOUS

## 2021-01-25 MED ORDER — CEFAZOLIN SODIUM-DEXTROSE 2-3 GM-%(50ML) IV SOLR
INTRAVENOUS | Status: DC | PRN
  Administered 2021-01-25: 2 g via INTRAVENOUS

## 2021-01-25 MED ORDER — PROPOFOL 500 MG/50ML IV EMUL
INTRAVENOUS | Status: AC
  Filled 2021-01-25: qty 50

## 2021-01-25 MED ORDER — ACETAMINOPHEN 500 MG PO TABS
ORAL_TABLET | ORAL | Status: AC
  Filled 2021-01-25: qty 2

## 2021-01-25 MED ORDER — LIDOCAINE-EPINEPHRINE 1 %-1:100000 IJ SOLN
INTRAMUSCULAR | Status: DC | PRN
  Administered 2021-01-25: 3.5 mL

## 2021-01-25 MED ORDER — ONDANSETRON HCL 4 MG/2ML IJ SOLN
INTRAMUSCULAR | Status: AC
  Filled 2021-01-25: qty 2

## 2021-01-25 MED ORDER — SCOPOLAMINE 1 MG/3DAYS TD PT72
MEDICATED_PATCH | TRANSDERMAL | Status: AC
  Filled 2021-01-25: qty 1

## 2021-01-25 MED ORDER — DEXAMETHASONE SODIUM PHOSPHATE 10 MG/ML IJ SOLN
INTRAMUSCULAR | Status: AC
  Filled 2021-01-25: qty 1

## 2021-01-25 MED ORDER — ACETAMINOPHEN 500 MG PO TABS
1000.0000 mg | ORAL_TABLET | Freq: Once | ORAL | Status: AC
  Administered 2021-01-25: 1000 mg via ORAL

## 2021-01-25 SURGICAL SUPPLY — 32 items
ATTRACTOMAT 16X20 MAGNETIC DRP (DRAPES) IMPLANT
CANISTER SUCT 1200ML W/VALVE (MISCELLANEOUS) ×2 IMPLANT
COAGULATOR SUCT 8FR VV (MISCELLANEOUS) ×2 IMPLANT
COVER WAND RF STERILE (DRAPES) IMPLANT
DECANTER SPIKE VIAL GLASS SM (MISCELLANEOUS) IMPLANT
DRSG NASOPORE 8CM (GAUZE/BANDAGES/DRESSINGS) IMPLANT
DRSG TELFA 3X8 NADH (GAUZE/BANDAGES/DRESSINGS) IMPLANT
ELECT REM PT RETURN 9FT ADLT (ELECTROSURGICAL) ×2
ELECTRODE REM PT RTRN 9FT ADLT (ELECTROSURGICAL) ×1 IMPLANT
GLOVE SURG ENC MOIS LTX SZ7.5 (GLOVE) ×2 IMPLANT
GLOVE SURG POLYISO LF SZ6.5 (GLOVE) ×4 IMPLANT
GLOVE SURG UNDER POLY LF SZ6.5 (GLOVE) ×4 IMPLANT
GOWN STRL REUS W/ TWL LRG LVL3 (GOWN DISPOSABLE) ×3 IMPLANT
GOWN STRL REUS W/TWL LRG LVL3 (GOWN DISPOSABLE) ×6
NEEDLE HYPO 25X1 1.5 SAFETY (NEEDLE) ×2 IMPLANT
NS IRRIG 1000ML POUR BTL (IV SOLUTION) ×2 IMPLANT
PACK BASIN DAY SURGERY FS (CUSTOM PROCEDURE TRAY) ×2 IMPLANT
PACK ENT DAY SURGERY (CUSTOM PROCEDURE TRAY) ×2 IMPLANT
SLEEVE SCD COMPRESS KNEE MED (STOCKING) ×2 IMPLANT
SOLUTION BUTLER CLEAR DIP (MISCELLANEOUS) ×2 IMPLANT
SPLINT NASAL AIRWAY SILICONE (MISCELLANEOUS) ×2 IMPLANT
SPONGE GAUZE 2X2 8PLY STRL LF (GAUZE/BANDAGES/DRESSINGS) ×2 IMPLANT
SPONGE NEURO XRAY DETECT 1X3 (DISPOSABLE) ×2 IMPLANT
SUT CHROMIC 4 0 P 3 18 (SUTURE) ×2 IMPLANT
SUT PLAIN 4 0 ~~LOC~~ 1 (SUTURE) ×2 IMPLANT
SUT PROLENE 3 0 PS 2 (SUTURE) ×2 IMPLANT
SUT VIC AB 4-0 P-3 18XBRD (SUTURE) IMPLANT
SUT VIC AB 4-0 P3 18 (SUTURE)
TOWEL GREEN STERILE FF (TOWEL DISPOSABLE) ×2 IMPLANT
TUBE SALEM SUMP 12R W/ARV (TUBING) IMPLANT
TUBE SALEM SUMP 16 FR W/ARV (TUBING) ×2 IMPLANT
YANKAUER SUCT BULB TIP NO VENT (SUCTIONS) ×2 IMPLANT

## 2021-01-25 NOTE — Anesthesia Procedure Notes (Signed)
Procedure Name: Intubation Performed by: Tawni Millers, CRNA Pre-anesthesia Checklist: Patient identified, Emergency Drugs available, Suction available and Patient being monitored Patient Re-evaluated:Patient Re-evaluated prior to induction Oxygen Delivery Method: Circle system utilized Preoxygenation: Pre-oxygenation with 100% oxygen Induction Type: IV induction Ventilation: Mask ventilation without difficulty Laryngoscope Size: Mac and 3 Grade View: Grade III Tube type: Oral Rae Number of attempts: 1 Airway Equipment and Method: Stylet and Oral airway Placement Confirmation: ETT inserted through vocal cords under direct vision,  positive ETCO2 and breath sounds checked- equal and bilateral Tube secured with: Tape Dental Injury: Teeth and Oropharynx as per pre-operative assessment

## 2021-01-25 NOTE — H&P (Signed)
Cc: Chronic nasal obstruction  HPI: The patient is a 55 year old female who returns today for follow up evaluation of chronic nasal obstruction, postnasal drainage, and intermittent hoarseness. The patient was last seen 2 months ago. At that time, she was noted to have nasal mucosal congestion, nasal septal deviation and bilateral inferior turbinate hypertrophy.  More than 95% of her nasal passageways were obstructed bilaterally. The patient was also noted to have moderate posterior laryngeal edema, consistent with laryngopharyngeal reflux. The patient's intermittent hoarseness was felt to be related to her laryngopharyngeal reflux, postnasal drainage, and habitual mouth breathing. The patient was continued on daily Flonase. She was also prescribed Atrovent nasal spray and famotidine. The patient returns today noting persistent nasal obstruction and hoarseness. Her post nasal drainage has improved with the use of Atrovent. No other ENT, GI, or respiratory issue noted since the last visit.   Exam: The flexible scope was inserted into the right nasal cavity. Severe NSD. Endoscopy of the interior nasal cavity, superior, inferior, and middle meatus was performed. The sphenoid-ethmoid recess was examined. Edematous mucosa was noted. No polyp, mass, or lesion was appreciated. Olfactory cleft was clear. Nasopharynx was clear. Turbinates were hypertrophied but without mass. Incomplete response to decongestion. The procedure was repeated on the contralateral side with similar findings. Moderate posterior laryngeal edema was noted. Vocal cords mobile without mass or lesion. The patient tolerated the procedure well.   Assessment: 1. Chronic rhinitis with nasal mucosal congestion, nasal septal deviation and bilateral inferior turbinate hypertrophy.  More than 95% of her nasal passageways are obstructed bilaterally.  2.  Chronic postnasal drainage.  3.  The patient is also noted to have moderate posterior laryngeal  edema, consistent with laryngopharyngeal reflux. No suspicious mass, lesions, polyps or infection is noted today.  4.  The patient's intermittent hoarseness may be a result of her laryngopharyngeal reflux, postnasal drainage, and habitual mouth breathing.   Plan:   1. Endoscopy findings are reviewed with the patient.  2. She should continue with famotidine daily and Atrovent nasal spray prn drainage.  3. Treatment options for her nasal obstruction include continuing daily Flonase versus septoplasty and bilateral inferior turbinate reduction. The risks, benefits, details, and alternatives of the procedure are discussed with the patient. Questions are invited and answered. 4. The patient would like to proceed with surgical intervention.

## 2021-01-25 NOTE — Op Note (Signed)
DATE OF PROCEDURE: 01/25/2021  OPERATIVE REPORT   SURGEON: Leta Baptist, MD   PREOPERATIVE DIAGNOSES:  1. Severe nasal septal deviation.  2. Bilateral inferior turbinate hypertrophy.  3. Chronic nasal obstruction.  POSTOPERATIVE DIAGNOSES:  1. Severe nasal septal deviation.  2. Bilateral inferior turbinate hypertrophy.  3. Chronic nasal obstruction.  PROCEDURE PERFORMED:  1. Septoplasty.  2. Bilateral partial inferior turbinate resection.   ANESTHESIA: General endotracheal tube anesthesia.   COMPLICATIONS: None.   ESTIMATED BLOOD LOSS: 100 mL.   INDICATION FOR PROCEDURE: Jenny Harrington is a 55 y.o. female with a history of chronic nasal obstruction. The patient was treated with antihistamine, decongestant, and steroid nasal sprays. However, the patient continued to be symptomatic. On examination, the patient was noted to have bilateral severe inferior turbinate hypertrophy and significant nasal septal deviation, causing significant nasal obstruction. Based on the above findings, the decision was made for the patient to undergo the above-stated procedures. The risks, benefits, alternatives, and details of the procedures were discussed with the patient. Questions were invited and answered. Informed consent was obtained.   DESCRIPTION OF PROCEDURE: The patient was taken to the operating room and placed supine on the operating table. General endotracheal tube anesthesia was administered by the anesthesiologist. The patient was positioned, and prepped and draped in the standard fashion for nasal surgery. Pledgets soaked with Afrin were placed in both nasal cavities for decongestion. The pledgets were subsequently removed.   Examination of the nasal cavity revealed a severe nasal septal deviation. 1% lidocaine with 1:100,000 epinephrine was injected onto the nasal septum bilaterally. A hemitransfixion incision was made on the left side. The mucosal flap was carefully elevated on the left side. A  cartilaginous incision was made 1 cm superior to the caudal margin of the nasal septum. Mucosal flap was also elevated on the right side in the similar fashion. It should be noted that due to the severe septal deviation, the deviated portion of the cartilaginous and bony septum had to be removed in piecemeal fashion. Once the deviated portions were removed, a straight midline septum was achieved. The septum was then quilted with 4-0 plain gut sutures. The hemitransfixion incision was closed with interrupted 4-0 chromic sutures.   The inferior one half of both hypertrophied inferior turbinate was crossclamped with a Kelly clamp. The inferior one half of each inferior turbinate was then resected with a pair of cross cutting scissors. Hemostasis was achieved with a suction cautery device.  Doyle splints were applied to the nasal septum.  The care of the patient was turned over to the anesthesiologist. The patient was awakened from anesthesia without difficulty. The patient was extubated and transferred to the recovery room in good condition.   OPERATIVE FINDINGS: Severe nasal septal deviation and bilateral inferior turbinate hypertrophy.   SPECIMEN: None.   FOLLOWUP CARE: The patient be discharged home once she is awake and alert. The patient will be placed on Percocet p.r.n. pain, and amoxicillin 875 mg p.o. b.i.d. for 3 days. The patient will follow up in my office in 3 days for splint removal.   Radley Barto Raynelle Bring, MD

## 2021-01-25 NOTE — Discharge Instructions (Addendum)
POSTOPERATIVE INSTRUCTIONS FOR PATIENTS HAVING NASAL OR SINUS OPERATIONS ACTIVITY: Restrict activity at home for the first two days, resting as much as possible. Light activity is best. You may usually return to work within a week. You should refrain from nose blowing, strenuous activity, or heavy lifting greater than 20lbs for a total of one week after your operation.  If sneezing cannot be avoided, sneeze with your mouth open. DISCOMFORT: You may experience a dull headache and pressure along with nasal congestion and discharge. These symptoms may be worse during the first week after the operation but may last as long as two to four weeks.  Please take Tylenol or the pain medication that has been prescribed for you. Do not take aspirin or aspirin containing medications since they may cause bleeding.  You may experience symptoms of post nasal drainage, nasal congestion, headaches and fatigue for two or three months after your operation.  BLEEDING: You may have some blood tinged nasal drainage for approximately two weeks after the operation.  The discharge will be worse for the first week.  Please call our office at [phone] or go to the nearest hospital emergency room if you experience any of the following: heavy, bright red blood from your nose or mouth that lasts longer than 15 minutes or coughing up or vomiting bright red blood or blood clots. GENERAL CONSIDERATIONS: 1. A gauze dressing will be placed on your upper lip to absorb any drainage after the operation. You may need to change this several times a day.  If you do not have very much drainage, you may remove the dressing.  Remember that you may gently wipe your nose with a tissue and sniff in, but DO NOT blow your nose. 2. Please keep all of your postoperative appointments.  Your final results after the operation will depend on proper follow-up.  The initial visit is usually 2 to 5 days after the operation.  During this visit, the remaining nasal  packing and internal septal splints will be removed.  Your nasal and sinus cavities will be cleaned.  During the second visit, your nasal and sinus cavities will be cleaned again. Have someone drive you to your first two postoperative appointments.  3. How you care for your nose after the operation will influence the results that you obtain.  You should follow all directions, take your medication as prescribed, and call our office [phone] with any problems or questions. 4. You may be more comfortable sleeping with your head elevated on two pillows. 5. Do not take any medications that we have not prescribed or recommended. WARNING SIGNS: if any of the following should occur, please call our office: 1. Persistent fever greater than 102F. 2. Persistent vomiting. 3. Severe and constant pain that is not relieved by prescribed pain medication. 4. Trauma to the nose. 5. Rash or unusual side effects from any medicines.   May take Tylenol after 1pm, if needed.    Post Anesthesia Home Care Instructions  Activity: Get plenty of rest for the remainder of the day. A responsible individual must stay with you for 24 hours following the procedure.  For the next 24 hours, DO NOT: -Drive a car -Paediatric nurse -Drink alcoholic beverages -Take any medication unless instructed by your physician -Make any legal decisions or sign important papers.  Meals: Start with liquid foods such as gelatin or soup. Progress to regular foods as tolerated. Avoid greasy, spicy, heavy foods. If nausea and/or vomiting occur, drink only clear liquids until  the nausea and/or vomiting subsides. Call your physician if vomiting continues.  Special Instructions/Symptoms: Your throat may feel dry or sore from the anesthesia or the breathing tube placed in your throat during surgery. If this causes discomfort, gargle with warm salt water. The discomfort should disappear within 24 hours.  If you had a scopolamine patch  placed behind your ear for the management of post- operative nausea and/or vomiting:  1. The medication in the patch is effective for 72 hours, after which it should be removed.  Wrap patch in a tissue and discard in the trash. Wash hands thoroughly with soap and water. 2. You may remove the patch earlier than 72 hours if you experience unpleasant side effects which may include dry mouth, dizziness or visual disturbances. 3. Avoid touching the patch. Wash your hands with soap and water after contact with the patch.

## 2021-01-25 NOTE — Transfer of Care (Signed)
Immediate Anesthesia Transfer of Care Note  Patient: Jenny Harrington  Procedure(s) Performed: NASAL SEPTOPLASTY WITH BILATERAL TURBINATE REDUCTION (N/A Nose)  Patient Location: PACU  Anesthesia Type:General  Level of Consciousness: awake and alert   Airway & Oxygen Therapy: Patient Spontanous Breathing and Patient connected to face mask oxygen  Post-op Assessment: Report given to RN and Post -op Vital signs reviewed and stable  Post vital signs: Reviewed and stable  Last Vitals:  Vitals Value Taken Time  BP 166/79 01/25/21 0915  Temp    Pulse 85 01/25/21 0915  Resp 14 01/25/21 0915  SpO2 100 % 01/25/21 0915  Vitals shown include unvalidated device data.  Last Pain:  Vitals:   01/25/21 0652  TempSrc: Oral  PainSc: 0-No pain      Patients Stated Pain Goal: 5 (64/33/29 5188)  Complications: No complications documented.

## 2021-01-25 NOTE — Anesthesia Postprocedure Evaluation (Signed)
Anesthesia Post Note  Patient: Jenny Harrington  Procedure(s) Performed: NASAL SEPTOPLASTY WITH BILATERAL TURBINATE REDUCTION (N/A Nose)     Patient location during evaluation: PACU Anesthesia Type: General Level of consciousness: awake and alert, oriented and patient cooperative Pain management: pain level controlled Vital Signs Assessment: post-procedure vital signs reviewed and stable Respiratory status: spontaneous breathing, nonlabored ventilation and respiratory function stable Cardiovascular status: blood pressure returned to baseline and stable Postop Assessment: no apparent nausea or vomiting Anesthetic complications: no   No complications documented.  Last Vitals:  Vitals:   01/25/21 0933 01/25/21 0944  BP: (!) 150/83 137/82  Pulse: 82 88  Resp: 15 16  Temp:  36.6 C  SpO2: 98% 99%    Last Pain:  Vitals:   01/25/21 0944  TempSrc:   PainSc: 0-No pain                 Pervis Hocking

## 2021-01-26 ENCOUNTER — Emergency Department (HOSPITAL_COMMUNITY)
Admission: EM | Admit: 2021-01-26 | Discharge: 2021-01-26 | Disposition: A | Discharge status: Home / Self Care | Payer: BC Managed Care – PPO | Attending: Emergency Medicine | Admitting: Emergency Medicine

## 2021-01-26 ENCOUNTER — Encounter (HOSPITAL_BASED_OUTPATIENT_CLINIC_OR_DEPARTMENT_OTHER): Payer: Self-pay | Admitting: Otolaryngology

## 2021-01-26 ENCOUNTER — Other Ambulatory Visit: Payer: Self-pay

## 2021-01-26 DIAGNOSIS — I10 Essential (primary) hypertension: Secondary | ICD-10-CM | POA: Diagnosis not present

## 2021-01-26 DIAGNOSIS — R040 Epistaxis: Secondary | ICD-10-CM

## 2021-01-26 DIAGNOSIS — Z79899 Other long term (current) drug therapy: Secondary | ICD-10-CM | POA: Insufficient documentation

## 2021-01-26 DIAGNOSIS — Z87891 Personal history of nicotine dependence: Secondary | ICD-10-CM | POA: Insufficient documentation

## 2021-01-26 MED ORDER — OXYMETAZOLINE HCL 0.05 % NA SOLN
1.0000 | Freq: Once | NASAL | Status: AC
  Administered 2021-01-26: 1 via NASAL
  Filled 2021-01-26: qty 30

## 2021-01-26 MED ORDER — SODIUM CHLORIDE 0.9 % IV BOLUS
1000.0000 mL | Freq: Once | INTRAVENOUS | Status: AC
  Administered 2021-01-26: 1000 mL via INTRAVENOUS

## 2021-01-26 NOTE — Discharge Instructions (Addendum)
As discussed, go directly to Dr. Deeann Saint office in [place].

## 2021-01-26 NOTE — ED Provider Notes (Signed)
[facility] EMERGENCY DEPARTMENT Provider Note   CSN: [id number] Arrival date & time: 01/26/21  1050     History Chief Complaint  Patient presents with  . Epistaxis    Jenny Harrington is a 55 y.o. female.  HPI      Jenny Harrington is a 55 y.o. female who presents to the Emergency Department for evaluation of epistaxis.  Had septoplasty procedure yesterday by Dr. Benjamine Mola bilateral stents were applied.  Patient states that she had a small amount of bleeding after discharge home, but developed significant bleeding last evening.  Unable to control with pressure.  States that she contacted Dr. Deeann Saint office, and was advised to come to the emergency department for further evaluation.  Past Medical History:  Diagnosis Date  . Anxiety    pt. denies  . Hyperlipidemia   . Hypertension   . Pneumonia   . Seasonal allergies   . Sleep apnea    no cpap    Patient Active Problem List   Diagnosis Date Noted  . Uterine fibroid 10/08/2018  . Fibroids 10/08/2018    Past Surgical History:  Procedure Laterality Date  . ABDOMINAL HYSTERECTOMY     Dr. Ronita Hipps 10-08-18  . DILITATION & CURRETTAGE/HYSTROSCOPY WITH HYDROTHERMAL ABLATION N/A 04/11/2016   Procedure: DILATATION & CURETTAGE/HYSTEROSCOPY WITH HYDROTHERMAL ABLATION;  Surgeon: Brien Few, MD;  Location: [facility];  Service: Gynecology;  Laterality: N/A;  . EYE SURGERY     lasik - bilateral  . NASAL SEPTOPLASTY W/ TURBINOPLASTY N/A 01/25/2021   Procedure: NASAL SEPTOPLASTY WITH BILATERAL TURBINATE REDUCTION;  Surgeon: Leta Baptist, MD;  Location: [place];  Service: ENT;  Laterality: N/A;  . NOVASURE ABLATION     done in MD's office  . ROBOTIC ASSISTED LAPAROSCOPIC HYSTERECTOMY AND SALPINGECTOMY Bilateral 10/08/2018   Procedure: XI ROBOTIC ASSISTED LAPAROSCOPIC HYSTERECTOMY AND SALPINGECTOMY;  Surgeon: Brien Few, MD;  Location: [place];  Service: Gynecology;  Laterality: Bilateral;  . TONSILLECTOMY     . WISDOM TOOTH EXTRACTION       OB History   No obstetric history on file.     Family History  Problem Relation Age of Onset  . Breast cancer Neg Hx     Social History   Tobacco Use  . Smoking status: Former Smoker    Packs/day: 0.10    Years: 15.00    Pack years: 1.50    Types: Cigarettes    Quit date: 09/25/2005    Years since quitting: 15.3  . Smokeless tobacco: Never Used  Vaping Use  . Vaping Use: Never used  Substance Use Topics  . Alcohol use: Yes    Comment: social  . Drug use: No    Home Medications Prior to Admission medications   Medication Sig Start Date End Date Taking? Authorizing Provider  amoxicillin (AMOXIL) 875 MG tablet Take 1 tablet (875 mg total) by mouth 2 (two) times daily for 3 days. 01/25/21 01/28/21 Yes Leta Baptist, MD  cetirizine (ZYRTEC) 10 MG tablet Take 10 mg by mouth daily.   Yes [provider]  citalopram (CELEXA) 10 MG tablet Take 10 mg by mouth daily.   Yes [provider]  famotidine (PEPCID) 20 MG tablet Take 20 mg by mouth 2 (two) times daily as needed for heartburn or indigestion.   Yes [provider]  fluticasone (FLONASE) 50 MCG/ACT nasal spray Place 1 spray into both nostrils daily as needed for allergies or rhinitis.   Yes [provider]  ipratropium (ATROVENT) 0.06 % nasal spray Place 2 sprays into both nostrils daily as needed (drainage).   Yes [provider]  losartan-hydrochlorothiazide (HYZAAR) 100-25 MG tablet Take 1 tablet by mouth daily. 01/08/21  Yes [provider]  montelukast (SINGULAIR) 10 MG tablet Take 10 mg by mouth at bedtime. 11/23/20  Yes [provider]  Multiple Vitamin (MULTIVITAMIN WITH MINERALS) TABS tablet Take 1 tablet by mouth daily.   Yes [provider]  OVER THE COUNTER MEDICATION Take 2 tablets by mouth daily. Patient takes ALJ herbal supplement   Yes [provider]  oxyCODONE-acetaminophen (PERCOCET) 5-325 MG tablet Take 1  tablet by mouth every 4 (four) hours as needed for up to 3 days for severe pain. 01/25/21 01/28/21 Yes Leta Baptist, MD  simvastatin (ZOCOR) 10 MG tablet Take 10 mg by mouth daily.   Yes [provider]    Allergies    Patient has no known allergies.  Review of Systems   Review of Systems  Constitutional: Negative for chills, fatigue and fever.  HENT: Positive for nosebleeds. Negative for trouble swallowing.   Respiratory: Negative for shortness of breath.   Cardiovascular: Negative for chest pain.  Gastrointestinal: Negative for abdominal pain, nausea and vomiting.  Musculoskeletal: Negative for arthralgias, neck pain and neck stiffness.  Skin: Negative for rash.  Neurological: Negative for dizziness, syncope, weakness, numbness and headaches.  Hematological: Does not bruise/bleed easily.    Physical Exam Updated Vital Signs BP 134/73   Pulse 80   Temp 98.6 F (37 C) (Oral)   Resp 17   Ht 5\' 1"  (1.549 m)   Wt 76.2 kg   LMP 08/26/2018   SpO2 96%   BMI 31.74 kg/m   Physical Exam Vitals and nursing note reviewed.  Constitutional:      Appearance: Normal appearance. She is not ill-appearing.  HENT:     Nose: No nasal tenderness.     Right Nostril: Epistaxis present.     Left Nostril: Epistaxis present.     Comments: Epistaxis present bilaterally.  Bilateral nasal splints    Mouth/Throat:     Mouth: Mucous membranes are moist.     Pharynx: Uvula midline. No uvula swelling.     Comments: Blood seen draining into the posterior oropharynx Cardiovascular:     Rate and Rhythm: Normal rate and regular rhythm.     Pulses: Normal pulses.  Pulmonary:     Effort: Pulmonary effort is normal.  Musculoskeletal:        General: Normal range of motion.     Cervical back: Normal range of motion.  Skin:    General: Skin is warm.     Capillary Refill: Capillary refill takes less than 2 seconds.     Findings: No rash.  Neurological:     General: No focal deficit present.      Mental Status: She is alert.     Sensory: No sensory deficit.     Motor: No weakness.     ED Results / Procedures / Treatments   Labs (all labs ordered are listed, but only abnormal results are displayed) Labs Reviewed - No data to display  EKG None  Radiology No results found.  Procedures Procedures   Medications Ordered in ED Medications  sodium chloride 0.9 % bolus 1,000 mL (0 mLs Intravenous Stopped 01/26/21 1301)  oxymetazoline (AFRIN) 0.05 % nasal spray 1 spray (1 spray Each Nare Given 01/26/21 1257)    ED Course  I have reviewed the  triage vital signs and the nursing notes.  Pertinent labs & imaging results that were available during my care of the patient were reviewed by me and considered in my medical decision making (see chart for details).      Blood clots evacuated from bilateral nares using suction.  Afrin then sprayed into both nostrils.  Bleeding controlled, mild oozing still remains.   MDM Rules/Calculators/A&P                         Pt here with bilateral epistaxis, s/p septoplasty one day prior to arrival.  No recent blood thinner use.    Consulted Dr. Benjamine Mola and discussed findings.  He recommends nasal suction and Afrin.  Have pt come directly to his office.    Pt agreeable to this plan   Final Clinical Impression(s) / ED Diagnoses Final diagnoses:  Epistaxis    Rx / DC Orders ED Discharge Orders    None       Bufford Lope 01/28/21 2148    Milton Ferguson, MD 01/31/21 [phone]

## 2021-01-26 NOTE — ED Triage Notes (Signed)
Pt to er, pt states that she had a septoplasty yesterday at 8am, states that she is here for a nose bleed, states that she started bleeding last night.  States that she called her Psychologist, sport and exercise and his is not in the office, so she came to the er.  Denies dizziness

## 2021-04-21 DIAGNOSIS — H524 Presbyopia: Secondary | ICD-10-CM | POA: Diagnosis not present

## 2021-04-21 DIAGNOSIS — H5213 Myopia, bilateral: Secondary | ICD-10-CM | POA: Diagnosis not present

## 2021-04-25 DIAGNOSIS — L570 Actinic keratosis: Secondary | ICD-10-CM | POA: Diagnosis not present

## 2021-04-25 DIAGNOSIS — L738 Other specified follicular disorders: Secondary | ICD-10-CM | POA: Diagnosis not present

## 2021-04-25 DIAGNOSIS — D225 Melanocytic nevi of trunk: Secondary | ICD-10-CM | POA: Diagnosis not present

## 2021-04-25 DIAGNOSIS — L821 Other seborrheic keratosis: Secondary | ICD-10-CM | POA: Diagnosis not present

## 2021-04-25 DIAGNOSIS — L814 Other melanin hyperpigmentation: Secondary | ICD-10-CM | POA: Diagnosis not present

## 2021-04-25 DIAGNOSIS — L820 Inflamed seborrheic keratosis: Secondary | ICD-10-CM | POA: Diagnosis not present

## 2021-07-14 DIAGNOSIS — J302 Other seasonal allergic rhinitis: Secondary | ICD-10-CM | POA: Diagnosis not present

## 2021-07-14 DIAGNOSIS — Z6831 Body mass index (BMI) 31.0-31.9, adult: Secondary | ICD-10-CM | POA: Diagnosis not present

## 2021-07-14 DIAGNOSIS — I10 Essential (primary) hypertension: Secondary | ICD-10-CM | POA: Diagnosis not present

## 2021-07-14 DIAGNOSIS — E6609 Other obesity due to excess calories: Secondary | ICD-10-CM | POA: Diagnosis not present

## 2021-07-14 DIAGNOSIS — F329 Major depressive disorder, single episode, unspecified: Secondary | ICD-10-CM | POA: Diagnosis not present

## 2021-07-14 DIAGNOSIS — E782 Mixed hyperlipidemia: Secondary | ICD-10-CM | POA: Diagnosis not present

## 2021-07-20 ENCOUNTER — Other Ambulatory Visit: Payer: Self-pay | Admitting: Family Medicine

## 2021-07-20 DIAGNOSIS — Z1231 Encounter for screening mammogram for malignant neoplasm of breast: Secondary | ICD-10-CM

## 2021-08-26 ENCOUNTER — Ambulatory Visit
Admission: RE | Admit: 2021-08-26 | Discharge: 2021-08-26 | Disposition: A | Discharge status: Home / Self Care | Payer: BC Managed Care – PPO | Source: Ambulatory Visit | Attending: Family Medicine | Admitting: Family Medicine

## 2021-08-26 DIAGNOSIS — Z1231 Encounter for screening mammogram for malignant neoplasm of breast: Secondary | ICD-10-CM

## 2021-08-29 DIAGNOSIS — J310 Chronic rhinitis: Secondary | ICD-10-CM | POA: Diagnosis not present

## 2021-08-29 DIAGNOSIS — R0982 Postnasal drip: Secondary | ICD-10-CM | POA: Diagnosis not present

## 2021-08-29 DIAGNOSIS — J343 Hypertrophy of nasal turbinates: Secondary | ICD-10-CM | POA: Diagnosis not present

## 2021-09-06 ENCOUNTER — Encounter: Payer: Self-pay | Admitting: Gastroenterology

## 2021-09-14 ENCOUNTER — Other Ambulatory Visit: Payer: Self-pay

## 2021-09-14 ENCOUNTER — Ambulatory Visit (AMBULATORY_SURGERY_CENTER): Payer: BC Managed Care – PPO

## 2021-09-14 VITALS — Ht 62.0 in | Wt 165.0 lb

## 2021-09-14 DIAGNOSIS — Z1211 Encounter for screening for malignant neoplasm of colon: Secondary | ICD-10-CM

## 2021-09-14 MED ORDER — NA SULFATE-K SULFATE-MG SULF 17.5-3.13-1.6 GM/177ML PO SOLN: 1.0000 | Freq: Once | ORAL | 0 refills | Status: AC

## 2021-09-14 NOTE — Progress Notes (Signed)
Pre visit completed via phone call; Patient verified name, DOB, and address; No egg or soy allergy known to patient  No issues known to pt with past sedation with any surgeries or procedures Patient denies ever being told they had issues or difficulty with intubation  No FH of Malignant Hyperthermia Pt is not on diet pills Pt is not on home 02  Pt is not on blood thinners  Pt denies issues with constipation  No A fib or A flutter Pt is fully vaccinated for Covid x 2 + booster; NO PA's for preps discussed with pt in PV today  Discussed with pt there will be an out-of-pocket cost for prep and that varies from $0 to 70 +  dollars - pt verbalized understanding  Due to the COVID-19 pandemic we are asking patients to follow certain guidelines in PV and the [facility]   Pt aware of COVID protocols and [facility] guidelines   

## 2021-09-29 ENCOUNTER — Encounter: Payer: Self-pay | Admitting: Gastroenterology

## 2021-10-05 ENCOUNTER — Ambulatory Visit (AMBULATORY_SURGERY_CENTER): Payer: BC Managed Care – PPO | Admitting: Gastroenterology

## 2021-10-05 ENCOUNTER — Encounter: Payer: Self-pay | Admitting: Gastroenterology

## 2021-10-05 ENCOUNTER — Other Ambulatory Visit: Payer: Self-pay

## 2021-10-05 VITALS — BP 113/66 | HR 63 | Temp 98.6°F | Resp 11 | Ht 62.0 in | Wt 165.0 lb

## 2021-10-05 DIAGNOSIS — D120 Benign neoplasm of cecum: Secondary | ICD-10-CM | POA: Diagnosis not present

## 2021-10-05 DIAGNOSIS — Z1211 Encounter for screening for malignant neoplasm of colon: Secondary | ICD-10-CM | POA: Diagnosis not present

## 2021-10-05 MED ORDER — SODIUM CHLORIDE 0.9 % IV SOLN: 500.0000 mL | Freq: Once | INTRAVENOUS | Status: DC

## 2021-10-05 NOTE — Op Note (Signed)
[facility] Patient Name: Jenny Harrington Procedure Date: 10/05/2021 10:29 AM MRN: [id number] Endoscopist: Mauri Pole , MD Age: 56 Referring MD:  Date of Birth: Dec 22, 1965 Gender: Female Account #: [id number] Procedure:                Colonoscopy Indications:              Screening for colorectal malignant neoplasm Medicines:                Monitored Anesthesia Care Procedure:                Pre-Anesthesia Assessment:                           - Prior to the procedure, a History and Physical                            was performed, and patient medications and                            allergies were reviewed. The patient's tolerance of                            previous anesthesia was also reviewed. The risks                            and benefits of the procedure and the sedation                            options and risks were discussed with the patient.                            All questions were answered, and informed consent                            was obtained. Prior Anticoagulants: The patient has                            taken no previous anticoagulant or antiplatelet                            agents. ASA Grade Assessment: II - A patient with                            mild systemic disease. After reviewing the risks                            and benefits, the patient was deemed in                            satisfactory condition to undergo the procedure.                           After obtaining informed consent, the colonoscope  was passed under direct vision. Throughout the                            procedure, the patient's blood pressure, pulse, and                            oxygen saturations were monitored continuously. The                            Olympus Colonoscope [id number] was introduced through                            the anus and advanced to the the terminal ileum,                            with  identification of the appendiceal orifice and                            IC valve. The colonoscopy was performed without                            difficulty. The patient tolerated the procedure                            well. The quality of the bowel preparation was                            adequate. The ileocecal valve, appendiceal orifice,                            and rectum were photographed. Scope In: 10:39:44 AM Scope Out: 10:54:06 AM Scope Withdrawal Time: 0 hours 11 minutes 32 seconds  Total Procedure Duration: 0 hours 14 minutes 22 seconds  Findings:                 The perianal and digital rectal examinations were                            normal.                           Scattered small and large-mouthed diverticula were                            found in the sigmoid colon, descending colon,                            transverse colon, ascending colon and cecum.                           A less than 1 mm polyp was found in the appendiceal                            orifice. The polyp was sessile. The polyp was  removed with a cold biopsy forceps. Resection and                            retrieval were complete.                           Non-bleeding external and internal hemorrhoids were                            found during retroflexion. The hemorrhoids were                            small. Complications:            No immediate complications. Estimated Blood Loss:     Estimated blood loss was minimal. Impression:               - Moderate diverticulosis in the sigmoid colon, in                            the descending colon, in the transverse colon, in                            the ascending colon and in the cecum.                           - One less than 1 mm polyp at the appendiceal                            orifice, removed with a cold biopsy forceps.                            Resected and retrieved.                           -  Non-bleeding external and internal hemorrhoids. Recommendation:           - Patient has a contact number available for                            emergencies. The signs and symptoms of potential                            delayed complications were discussed with the                            patient. Return to normal activities tomorrow.                            Written discharge instructions were provided to the                            patient.                           - Resume previous diet.                           -  Continue present medications.                           - Await pathology results.                           - Repeat colonoscopy in 5-10 years for surveillance                            based on pathology results. Mauri Pole, MD 10/05/2021 11:00:41 AM This report has been signed electronically.

## 2021-10-05 NOTE — Progress Notes (Signed)
Report to PACU, RN, vss, BBS= Clear.  

## 2021-10-05 NOTE — Patient Instructions (Signed)
Information on polyps, diverticulosis and hemorrhoids given to you today.  Await pathology results.  Resume previous diet and medications.  YOU HAD AN ENDOSCOPIC PROCEDURE TODAY AT THE [facility]:   Refer to the procedure report that was given to you for any specific questions about what was found during the examination.  If the procedure report does not answer your questions, please call your gastroenterologist to clarify.  If you requested that your care partner not be given the details of your procedure findings, then the procedure report has been included in a sealed envelope for you to review at your convenience later.  YOU SHOULD EXPECT: Some feelings of bloating in the abdomen. Passage of more gas than usual.  Walking can help get rid of the air that was put into your GI tract during the procedure and reduce the bloating. If you had a lower endoscopy (such as a colonoscopy or flexible sigmoidoscopy) you may notice spotting of blood in your stool or on the toilet paper. If you underwent a bowel prep for your procedure, you may not have a normal bowel movement for a few days.  Please Note:  You might notice some irritation and congestion in your nose or some drainage.  This is from the oxygen used during your procedure.  There is no need for concern and it should clear up in a day or so.  SYMPTOMS TO REPORT IMMEDIATELY:   Following lower endoscopy (colonoscopy or flexible sigmoidoscopy):  Excessive amounts of blood in the stool  Significant tenderness or worsening of abdominal pains  Swelling of the abdomen that is new, acute  Fever of 100F or higher   For urgent or emergent issues, a gastroenterologist can be reached at any hour by calling [phone]. Do not use MyChart messaging for urgent concerns.    DIET:  We do recommend a small meal at first, but then you may proceed to your regular diet.  Drink plenty of fluids but you should avoid alcoholic beverages for 24  hours.  ACTIVITY:  You should plan to take it easy for the rest of today and you should NOT DRIVE or use heavy machinery until tomorrow (because of the sedation medicines used during the test).    FOLLOW UP: Our staff will call the number listed on your records 48-72 hours following your procedure to check on you and address any questions or concerns that you may have regarding the information given to you following your procedure. If we do not reach you, we will leave a message.  We will attempt to reach you two times.  During this call, we will ask if you have developed any symptoms of COVID 19. If you develop any symptoms (ie: fever, flu-like symptoms, shortness of breath, cough etc.) before then, please call [phone].  If you test positive for Covid 19 in the 2 weeks post procedure, please call and report this information to us.    If any biopsies were taken you will be contacted by phone or by letter within the next 1-3 weeks.  Please call us at [phone] if you have not heard about the biopsies in 3 weeks.    SIGNATURES/CONFIDENTIALITY: You and/or your care partner have signed paperwork which will be entered into your electronic medical record.  These signatures attest to the fact that that the information above on your After Visit Summary has been reviewed and is understood.  Full responsibility of the confidentiality of this discharge information lies with you   and/or your care-partner. 

## 2021-10-05 NOTE — Progress Notes (Signed)
[facility] History and Physical   Primary Care Physician:  Sharilyn Sites, MD   Reason for Procedure:  Colorectal cancer screening  Plan:    Screening colonoscopy with possible interventions as needed     HPI: Jenny Harrington is a very pleasant 56 y.o. female here for screening colonoscopy. Denies any nausea, vomiting, abdominal pain, melena or bright red blood per rectum  The risks and benefits as well as alternatives of endoscopic procedure(s) have been discussed and reviewed. All questions answered. The patient agrees to proceed.    Past Medical History:  Diagnosis Date   Anxiety    pt. denies   GERD (gastroesophageal reflux disease)    improvement with medications   Hyperlipidemia    on meds   Hypertension    on meds   Pneumonia    Seasonal allergies    Sleep apnea    no cpap    Past Surgical History:  Procedure Laterality Date   DILITATION & CURRETTAGE/HYSTROSCOPY WITH HYDROTHERMAL ABLATION N/A 04/11/2016   Procedure: DILATATION & CURETTAGE/HYSTEROSCOPY WITH HYDROTHERMAL ABLATION;  Surgeon: Brien Few, MD;  Location: [facility];  Service: Gynecology;  Laterality: N/A;   EYE SURGERY     lasik - bilateral   NASAL SEPTOPLASTY W/ TURBINOPLASTY N/A 01/25/2021   Procedure: NASAL SEPTOPLASTY WITH BILATERAL TURBINATE REDUCTION;  Surgeon: Leta Baptist, MD;  Location: [facility];  Service: ENT;  Laterality: N/A;   NOVASURE ABLATION     done in MD's office   ROBOTIC ASSISTED LAPAROSCOPIC HYSTERECTOMY AND SALPINGECTOMY Bilateral 10/08/2018   Procedure: XI ROBOTIC ASSISTED LAPAROSCOPIC HYSTERECTOMY AND SALPINGECTOMY;  Surgeon: Brien Few, MD;  Location: [facility];  Service: Gynecology;  Laterality: Bilateral;   TONSILLECTOMY     WISDOM TOOTH EXTRACTION      Prior to Admission medications   Medication Sig Start Date End Date Taking? Authorizing Provider  aspirin 81 MG EC tablet Take 1 tablet by mouth daily at 6 (six) AM.   Yes  [provider]  citalopram (CELEXA) 20 MG tablet Take 20 mg by mouth daily. 08/26/21  Yes [provider]  famotidine (PEPCID) 20 MG tablet Take 20 mg by mouth at bedtime.   Yes [provider]  fluticasone (FLONASE) 50 MCG/ACT nasal spray Place 1 spray into both nostrils daily as needed for allergies or rhinitis.   Yes [provider]  ipratropium (ATROVENT) 0.06 % nasal spray Place 2 sprays into both nostrils daily as needed (drainage).   Yes [provider]  levocetirizine (XYZAL) 5 MG tablet Take 5 mg by mouth at bedtime. 09/09/21  Yes [provider]  losartan-hydrochlorothiazide (HYZAAR) 100-25 MG tablet Take 1 tablet by mouth daily. 01/08/21  Yes [provider]  montelukast (SINGULAIR) 10 MG tablet Take 10 mg by mouth at bedtime. 11/23/20  Yes [provider]  Multiple Vitamin (MULTIVITAMIN WITH MINERALS) TABS tablet Take 1 tablet by mouth daily.   Yes [provider]  OVER THE COUNTER MEDICATION Take 2 tablets by mouth daily. Patient takes ALJ herbal supplement   Yes [provider]  simvastatin (ZOCOR) 10 MG tablet Take 10 mg by mouth daily.   Yes [provider]  cetirizine (ZYRTEC) 10 MG tablet Take 10 mg by mouth daily. Patient not taking: Reported on 09/14/2021    [provider]  clindamycin (CLEOCIN T) 1 % lotion Apply 1 application topically daily as needed. 04/26/21   [provider]    Current Outpatient Medications  Medication  Sig Dispense Refill   aspirin 81 MG EC tablet Take 1 tablet by mouth daily at 6 (six) AM.     citalopram (CELEXA) 20 MG tablet Take 20 mg by mouth daily.     famotidine (PEPCID) 20 MG tablet Take 20 mg by mouth at bedtime.     fluticasone (FLONASE) 50 MCG/ACT nasal spray Place 1 spray into both nostrils daily as needed for allergies or rhinitis.     ipratropium (ATROVENT) 0.06 % nasal spray Place 2 sprays into both nostrils daily as needed  (drainage).     levocetirizine (XYZAL) 5 MG tablet Take 5 mg by mouth at bedtime.     losartan-hydrochlorothiazide (HYZAAR) 100-25 MG tablet Take 1 tablet by mouth daily.     montelukast (SINGULAIR) 10 MG tablet Take 10 mg by mouth at bedtime.     Multiple Vitamin (MULTIVITAMIN WITH MINERALS) TABS tablet Take 1 tablet by mouth daily.     OVER THE COUNTER MEDICATION Take 2 tablets by mouth daily. Patient takes ALJ herbal supplement     simvastatin (ZOCOR) 10 MG tablet Take 10 mg by mouth daily.     cetirizine (ZYRTEC) 10 MG tablet Take 10 mg by mouth daily. (Patient not taking: Reported on 09/14/2021)     clindamycin (CLEOCIN T) 1 % lotion Apply 1 application topically daily as needed.     Current Facility-Administered Medications  Medication Dose Route Frequency Provider Last Rate Last Admin   0.9 %  sodium chloride infusion  500 mL Intravenous Once Mauri Pole, MD        Allergies as of 10/05/2021   (No Known Allergies)    Family History  Problem Relation Age of Onset   Colon polyps Father 55   Breast cancer Neg Hx    Colon cancer Neg Hx    Esophageal cancer Neg Hx    Rectal cancer Neg Hx    Stomach cancer Neg Hx     Social History   Socioeconomic History   Marital status: Single    Spouse name: Not on file   Number of children: Not on file   Years of education: Not on file   Highest education level: Not on file  Occupational History   Not on file  Tobacco Use   Smoking status: Former    Packs/day: 0.10    Years: 15.00    Pack years: 1.50    Types: Cigarettes    Quit date: 09/25/2005    Years since quitting: 16.0   Smokeless tobacco: Never  Vaping Use   Vaping Use: Never used  Substance and Sexual Activity   Alcohol use: Not Currently    Alcohol/week: 0.0 - 3.0 standard drinks    Comment: social   Drug use: No   Sexual activity: Not Currently    Birth control/protection: Pill  Other Topics Concern   Not on file  Social History Narrative   Not on file    Social Determinants of Health   Financial Resource Strain: Not on file  Food Insecurity: Not on file  Transportation Needs: Not on file  Physical Activity: Not on file  Stress: Not on file  Social Connections: Not on file  Intimate Partner Violence: Not on file    Review of Systems:  All other review of systems negative except as mentioned in the HPI.  Physical Exam: Vital signs in last 24 hours: BP 136/73    Pulse 75    Temp 98.6 F (37 C) (Skin)  Ht 5\' 2"  (1.575 m)    Wt 165 lb (74.8 kg)    LMP 08/26/2018    SpO2 100%    BMI 30.18 kg/m  General:   Alert, NAD Lungs:  Clear .   Heart:  Regular rate and rhythm Abdomen:  Soft, nontender and nondistended. Neuro/Psych:  Alert and cooperative. Normal mood and affect. A and O x 3  Reviewed labs, radiology imaging, old records and pertinent past GI work up  Patient is appropriate for planned procedure(s) and anesthesia in an ambulatory setting   K. Denzil Magnuson , MD [phone]

## 2021-10-05 NOTE — Progress Notes (Signed)
Called to room to assist during endoscopic procedure.  Patient ID and intended procedure confirmed with present staff. Received instructions for my participation in the procedure from the performing physician.  

## 2021-10-05 NOTE — Progress Notes (Signed)
Pt's states no medical or surgical changes since previsit or office visit. VS assessed by N.C ?

## 2021-10-07 ENCOUNTER — Telehealth: Payer: Self-pay

## 2021-10-07 NOTE — Telephone Encounter (Signed)
°  Follow up Call-  Call back number 10/05/2021  Post procedure Call Back phone  # [phone]  Permission to leave phone message Yes  Some recent data might be hidden     Patient questions:  Do you have a fever, pain , or abdominal swelling? No. Pain Score  0 *  Have you tolerated food without any problems? Yes.    Have you been able to return to your normal activities? Yes.    Do you have any questions about your discharge instructions: Diet   No. Medications  No. Follow up visit  No.  Do you have questions or concerns about your Care? No.  Actions: * If pain score is 4 or above: No action needed, pain <4.

## 2021-10-19 ENCOUNTER — Encounter: Payer: Self-pay | Admitting: Gastroenterology

## 2022-02-21 DIAGNOSIS — H43811 Vitreous degeneration, right eye: Secondary | ICD-10-CM | POA: Diagnosis not present

## 2022-04-04 DIAGNOSIS — H33312 Horseshoe tear of retina without detachment, left eye: Secondary | ICD-10-CM | POA: Diagnosis not present

## 2022-04-04 DIAGNOSIS — H524 Presbyopia: Secondary | ICD-10-CM | POA: Diagnosis not present

## 2022-04-04 DIAGNOSIS — H43811 Vitreous degeneration, right eye: Secondary | ICD-10-CM | POA: Diagnosis not present

## 2022-04-04 DIAGNOSIS — H5213 Myopia, bilateral: Secondary | ICD-10-CM | POA: Diagnosis not present

## 2022-04-24 DIAGNOSIS — L821 Other seborrheic keratosis: Secondary | ICD-10-CM | POA: Diagnosis not present

## 2022-04-24 DIAGNOSIS — D2372 Other benign neoplasm of skin of left lower limb, including hip: Secondary | ICD-10-CM | POA: Diagnosis not present

## 2022-04-24 DIAGNOSIS — L814 Other melanin hyperpigmentation: Secondary | ICD-10-CM | POA: Diagnosis not present

## 2022-07-18 ENCOUNTER — Other Ambulatory Visit: Payer: Self-pay | Admitting: Family Medicine

## 2022-07-18 DIAGNOSIS — Z1231 Encounter for screening mammogram for malignant neoplasm of breast: Secondary | ICD-10-CM

## 2022-08-01 DIAGNOSIS — E782 Mixed hyperlipidemia: Secondary | ICD-10-CM | POA: Diagnosis not present

## 2022-08-01 DIAGNOSIS — I10 Essential (primary) hypertension: Secondary | ICD-10-CM | POA: Diagnosis not present

## 2022-08-01 DIAGNOSIS — F419 Anxiety disorder, unspecified: Secondary | ICD-10-CM | POA: Diagnosis not present

## 2022-08-01 DIAGNOSIS — Z6831 Body mass index (BMI) 31.0-31.9, adult: Secondary | ICD-10-CM | POA: Diagnosis not present

## 2022-08-01 DIAGNOSIS — J302 Other seasonal allergic rhinitis: Secondary | ICD-10-CM | POA: Diagnosis not present

## 2022-08-01 DIAGNOSIS — Z0000 Encounter for general adult medical examination without abnormal findings: Secondary | ICD-10-CM | POA: Diagnosis not present

## 2022-08-01 DIAGNOSIS — E785 Hyperlipidemia, unspecified: Secondary | ICD-10-CM | POA: Diagnosis not present

## 2022-08-01 DIAGNOSIS — E6609 Other obesity due to excess calories: Secondary | ICD-10-CM | POA: Diagnosis not present

## 2022-08-01 DIAGNOSIS — F329 Major depressive disorder, single episode, unspecified: Secondary | ICD-10-CM | POA: Diagnosis not present

## 2022-08-01 DIAGNOSIS — Z1331 Encounter for screening for depression: Secondary | ICD-10-CM | POA: Diagnosis not present

## 2022-09-04 DIAGNOSIS — E6609 Other obesity due to excess calories: Secondary | ICD-10-CM | POA: Diagnosis not present

## 2022-09-04 DIAGNOSIS — I10 Essential (primary) hypertension: Secondary | ICD-10-CM | POA: Diagnosis not present

## 2022-09-04 DIAGNOSIS — Z6831 Body mass index (BMI) 31.0-31.9, adult: Secondary | ICD-10-CM | POA: Diagnosis not present

## 2022-09-04 DIAGNOSIS — E039 Hypothyroidism, unspecified: Secondary | ICD-10-CM | POA: Diagnosis not present

## 2022-09-04 DIAGNOSIS — E785 Hyperlipidemia, unspecified: Secondary | ICD-10-CM | POA: Diagnosis not present

## 2022-09-12 ENCOUNTER — Ambulatory Visit
Admission: RE | Admit: 2022-09-12 | Discharge: 2022-09-12 | Disposition: A | Discharge status: Home / Self Care | Payer: BC Managed Care – PPO | Source: Ambulatory Visit | Attending: Family Medicine | Admitting: Family Medicine

## 2022-09-12 DIAGNOSIS — Z1231 Encounter for screening mammogram for malignant neoplasm of breast: Secondary | ICD-10-CM

## 2022-11-14 DIAGNOSIS — F411 Generalized anxiety disorder: Secondary | ICD-10-CM | POA: Diagnosis not present

## 2022-11-14 DIAGNOSIS — G4700 Insomnia, unspecified: Secondary | ICD-10-CM | POA: Diagnosis not present

## 2023-08-13 ENCOUNTER — Other Ambulatory Visit: Payer: Self-pay | Admitting: Family Medicine

## 2023-08-13 DIAGNOSIS — Z0000 Encounter for general adult medical examination without abnormal findings: Secondary | ICD-10-CM

## 2023-09-17 ENCOUNTER — Ambulatory Visit
Admission: RE | Admit: 2023-09-17 | Discharge: 2023-09-17 | Disposition: A | Discharge status: Home / Self Care | Payer: Managed Care, Other (non HMO) | Source: Ambulatory Visit | Attending: Family Medicine | Admitting: Family Medicine

## 2023-09-17 DIAGNOSIS — Z0000 Encounter for general adult medical examination without abnormal findings: Secondary | ICD-10-CM

## 2024-06-07 ENCOUNTER — Encounter: Payer: Self-pay | Admitting: *Deleted

## 2024-08-06 ENCOUNTER — Other Ambulatory Visit: Payer: Self-pay | Admitting: Family Medicine

## 2024-08-06 DIAGNOSIS — Z1231 Encounter for screening mammogram for malignant neoplasm of breast: Secondary | ICD-10-CM

## 2024-09-14 ENCOUNTER — Other Ambulatory Visit: Payer: Self-pay | Admitting: Medical Genetics

## 2024-09-16 ENCOUNTER — Other Ambulatory Visit (HOSPITAL_COMMUNITY)
Admission: RE | Admit: 2024-09-16 | Discharge: 2024-09-16 | Disposition: A | Discharge status: Home / Self Care | Payer: Self-pay | Source: Ambulatory Visit | Attending: Medical Genetics | Admitting: Medical Genetics

## 2024-09-19 ENCOUNTER — Ambulatory Visit
Admission: RE | Admit: 2024-09-19 | Discharge: 2024-09-19 | Disposition: A | Discharge status: Home / Self Care | Source: Ambulatory Visit | Attending: Family Medicine | Admitting: Family Medicine

## 2024-09-19 DIAGNOSIS — Z1231 Encounter for screening mammogram for malignant neoplasm of breast: Secondary | ICD-10-CM

## 2024-10-03 LAB — GENECONNECT MOLECULAR SCREEN: Genetic Analysis Overall Interpretation: NEGATIVE
